# Patient Record
Sex: Female | Born: 1950 | Race: White | Hispanic: No | Marital: Married | Smoking: Never smoker
Health system: Southern US, Community
[De-identification: ages and names within clinical notes are randomized; demographics above are authoritative.]

## PROBLEM LIST (undated history)

## (undated) DIAGNOSIS — R519 Headache, unspecified: Secondary | ICD-10-CM

## (undated) DIAGNOSIS — K259 Gastric ulcer, unspecified as acute or chronic, without hemorrhage or perforation: Secondary | ICD-10-CM

## (undated) DIAGNOSIS — F419 Anxiety disorder, unspecified: Secondary | ICD-10-CM

## (undated) DIAGNOSIS — L570 Actinic keratosis: Secondary | ICD-10-CM

## (undated) DIAGNOSIS — C801 Malignant (primary) neoplasm, unspecified: Secondary | ICD-10-CM

## (undated) DIAGNOSIS — M790 Rheumatism, unspecified: Secondary | ICD-10-CM

## (undated) DIAGNOSIS — N94810 Vulvar vestibulitis: Secondary | ICD-10-CM

## (undated) DIAGNOSIS — N3289 Other specified disorders of bladder: Secondary | ICD-10-CM

## (undated) DIAGNOSIS — M810 Age-related osteoporosis without current pathological fracture: Secondary | ICD-10-CM

## (undated) DIAGNOSIS — E785 Hyperlipidemia, unspecified: Secondary | ICD-10-CM

## (undated) HISTORY — DX: Actinic keratosis: L57.0

## (undated) HISTORY — DX: Vulvar vestibulitis: N94.810

## (undated) HISTORY — DX: Rheumatism, unspecified: M79.0

## (undated) HISTORY — PX: RHINOPLASTY: SUR1284

## (undated) HISTORY — PX: TONSILLECTOMY: SUR1361

## (undated) HISTORY — PX: MASTECTOMY: SHX3

## (undated) HISTORY — PX: BREAST SURGERY: SHX581

---

## 2002-12-21 HISTORY — PX: COLONOSCOPY: SHX174

## 2006-02-05 ENCOUNTER — Emergency Department: Payer: Self-pay | Admitting: General Practice

## 2006-02-05 ENCOUNTER — Other Ambulatory Visit: Payer: Self-pay

## 2010-08-05 ENCOUNTER — Ambulatory Visit: Payer: Self-pay

## 2011-09-04 ENCOUNTER — Encounter: Payer: Self-pay | Admitting: Internal Medicine

## 2011-09-04 ENCOUNTER — Ambulatory Visit (INDEPENDENT_AMBULATORY_CARE_PROVIDER_SITE_OTHER): Payer: BC Managed Care – PPO | Admitting: Internal Medicine

## 2011-09-04 DIAGNOSIS — F411 Generalized anxiety disorder: Secondary | ICD-10-CM

## 2011-09-04 DIAGNOSIS — M949 Disorder of cartilage, unspecified: Secondary | ICD-10-CM

## 2011-09-04 DIAGNOSIS — Z0000 Encounter for general adult medical examination without abnormal findings: Secondary | ICD-10-CM

## 2011-09-04 DIAGNOSIS — F419 Anxiety disorder, unspecified: Secondary | ICD-10-CM

## 2011-09-04 DIAGNOSIS — M8580 Other specified disorders of bone density and structure, unspecified site: Secondary | ICD-10-CM

## 2011-09-04 MED ORDER — CLORAZEPATE DIPOTASSIUM 7.5 MG PO TABS: ORAL_TABLET | ORAL | Status: DC

## 2011-09-05 DIAGNOSIS — M810 Age-related osteoporosis without current pathological fracture: Secondary | ICD-10-CM | POA: Insufficient documentation

## 2011-09-05 DIAGNOSIS — F419 Anxiety disorder, unspecified: Secondary | ICD-10-CM | POA: Insufficient documentation

## 2011-09-05 NOTE — Progress Notes (Signed)
Subjective:    Patient ID: Kiara Donovan, female    DOB: November 25, 1951, 60 y.o.   MRN: [id number]  HPI Ms. Kiara Donovan is a 60 year old female who presents to establish care and for her annual exam. She denies any complaints today. She notes a history of anxiety, which has recently worsened in the setting of increased responsibilities and 2 during a student over the summer. She has been using clorazepate with resolution of her symptoms. Aside from this she has no complaints or concerns today. She reports a healthy diet. She reports that she is very active.  Outpatient Encounter Prescriptions as of 09/04/2011  Medication Sig Dispense Refill  . calcium carbonate (OS-CAL) 600 MG TABS Take 600 mg by mouth daily.        . Cholecalciferol (VITAMIN D3) 1000 UNITS CAPS Take 1 capsule by mouth daily.        . clorazepate (TRANXENE) 7.5 MG tablet 1.5 tablets by mouth  daily  45 tablet  5  . Multiple Vitamin (MULTIVITAMIN) capsule Take 1 capsule by mouth daily.        Marland Kitchen DISCONTD: clorazepate (TRANXENE) 7.5 MG tablet Take 7.5 mg by mouth daily.          Review of Systems  Constitutional: Negative for fever, chills, diaphoresis, activity change, appetite change, fatigue and unexpected weight change.  HENT: Negative for hearing loss, ear pain, congestion, sore throat, rhinorrhea, sneezing, drooling, mouth sores, trouble swallowing, dental problem, voice change, postnasal drip, sinus pressure and tinnitus.   Eyes: Negative for visual disturbance.  Respiratory: Negative for cough, choking, chest tightness and shortness of breath.   Cardiovascular: Negative for chest pain, palpitations and leg swelling.  Gastrointestinal: Negative for nausea, vomiting, abdominal pain, diarrhea, constipation, blood in stool, abdominal distention, anal bleeding and rectal pain.  Genitourinary: Positive for dyspareunia. Negative for dysuria, urgency, frequency, hematuria, flank pain, decreased urine volume, vaginal bleeding,  vaginal discharge, enuresis, difficulty urinating, vaginal pain, menstrual problem and pelvic pain.  Musculoskeletal: Negative for myalgias, back pain, joint swelling, arthralgias and gait problem.  Skin: Negative for color change, rash and wound.  Neurological: Negative for dizziness, tremors, seizures, syncope, facial asymmetry, speech difficulty, weakness, light-headedness, numbness and headaches.  Hematological: Negative for adenopathy. Does not bruise/bleed easily.  Psychiatric/Behavioral: Negative for suicidal ideas, hallucinations, sleep disturbance, dysphoric mood and decreased concentration. The patient is nervous/anxious.    BP 123/85  Pulse 79  Temp(Src) 98.6 F (37 C) (Oral)  Resp 12  Ht 5' 3.5" (1.613 m)  Wt 133 lb 8 oz (60.555 kg)  BMI 23.28 kg/m2  SpO2 100%     Objective:   Physical Exam  Constitutional: She is oriented to person, place, and time. She appears well-developed and well-nourished. No distress.  HENT:  Head: Normocephalic and atraumatic.  Right Ear: External ear normal.  Left Ear: External ear normal.  Nose: Nose normal.  Mouth/Throat: Oropharynx is clear and moist. No oropharyngeal exudate.  Eyes: Conjunctivae are normal. Pupils are equal, round, and reactive to light. Right eye exhibits no discharge. Left eye exhibits no discharge. No scleral icterus.  Neck: Normal range of motion. Neck supple. No tracheal deviation present. No thyromegaly present.  Cardiovascular: Normal rate, regular rhythm, normal heart sounds and intact distal pulses.  Exam reveals no gallop and no friction rub.   No murmur heard. Pulmonary/Chest: Effort normal and breath sounds normal. No respiratory distress. She has no wheezes. She has no rales. She exhibits no tenderness.  Abdominal: Soft. Bowel sounds are normal.  She exhibits no distension and no mass. There is no tenderness. There is no rebound and no guarding.  Genitourinary: Rectum normal and uterus normal. No breast swelling,  tenderness, discharge or bleeding. Pelvic exam was performed with patient prone. There is no rash, tenderness or lesion on the right labia. There is no rash, tenderness or lesion on the left labia. Uterus is not enlarged and not tender. Cervix exhibits friability. Cervix exhibits no motion tenderness and no discharge. Right adnexum displays no mass, no tenderness and no fullness. Left adnexum displays no mass, no tenderness and no fullness. There is erythema (atrophic vaginitis) around the vagina. No tenderness around the vagina. No vaginal discharge found.  Musculoskeletal: Normal range of motion. She exhibits no edema and no tenderness.  Lymphadenopathy:    She has no cervical adenopathy.  Neurological: She is alert and oriented to person, place, and time. No cranial nerve deficit. She exhibits normal muscle tone. Coordination normal.  Skin: Skin is warm and dry. No rash noted. She is not diaphoretic. No erythema. No pallor.  Psychiatric: She has a normal mood and affect. Her behavior is normal. Judgment and thought content normal.          Assessment & Plan:  1. General Exam - patient presents to establish care and for her annual exam. She appears to be very healthy. BMI is normal. She follows a healthy diet and gets regular physical activity. She is a nonsmoker. Her health maintenance including mammogram, colonoscopy, and vaccinations are up to date. We reviewed DEXA scan today, and she is due for repeat DEXA scan. We will schedule this. Pap smear was performed today. Breast exam was normal today. We will perform yearly lab work including CBC, CMP, lipid profile, vitamin D. We will plan to call her with results. She was encouraged to get a flu shot once they are available.  2. Anxiety - patient with history of intermittent anxiety. She currently uses clorazepate with resolution of her symptoms. Refill was given on this medication today. She will call or return to clinic should symptoms  worsen.  3. Osteopenia - previous DEXA scan showed some areas of osteopenia in her lumbar spine and hip. We will plan to repeat DEXA scan in the next few weeks. She is currently taking calcium and vitamin D. We will check vitamin D level with labs. We will call her with results of DEXA scan.

## 2011-09-07 ENCOUNTER — Other Ambulatory Visit (HOSPITAL_COMMUNITY)
Admission: RE | Admit: 2011-09-07 | Discharge: 2011-09-07 | Disposition: A | Discharge status: Home / Self Care | Payer: BC Managed Care – PPO | Source: Ambulatory Visit | Attending: Internal Medicine | Admitting: Internal Medicine

## 2011-09-07 DIAGNOSIS — Z01419 Encounter for gynecological examination (general) (routine) without abnormal findings: Secondary | ICD-10-CM | POA: Insufficient documentation

## 2011-09-07 DIAGNOSIS — Z1159 Encounter for screening for other viral diseases: Secondary | ICD-10-CM | POA: Insufficient documentation

## 2011-09-07 NOTE — Progress Notes (Signed)
Addended by: Jobie Quaker on: 09/07/2011 11:39 AM   Modules accepted: Orders

## 2011-09-08 LAB — HM PAP SMEAR: HM Pap smear: NEGATIVE

## 2011-09-08 NOTE — Progress Notes (Signed)
Addended by: Melody Comas L on: 09/08/2011 12:01 PM   Modules accepted: Orders

## 2011-09-10 ENCOUNTER — Encounter: Payer: Self-pay | Admitting: Internal Medicine

## 2011-09-10 ENCOUNTER — Other Ambulatory Visit (INDEPENDENT_AMBULATORY_CARE_PROVIDER_SITE_OTHER): Payer: BC Managed Care – PPO | Admitting: *Deleted

## 2011-09-10 DIAGNOSIS — Z0000 Encounter for general adult medical examination without abnormal findings: Secondary | ICD-10-CM

## 2011-09-10 LAB — CBC WITH DIFFERENTIAL/PLATELET
Eosinophils Relative: 6.2 % — ABNORMAL HIGH (ref 0.0–5.0)
HCT: 38.6 % (ref 36.0–46.0)
Lymphocytes Relative: 27.2 % (ref 12.0–46.0)
Monocytes Relative: 8.3 % (ref 3.0–12.0)
Neutrophils Relative %: 57.7 % (ref 43.0–77.0)
Platelets: 180 10*3/uL (ref 150.0–400.0)
WBC: 5.1 10*3/uL (ref 4.5–10.5)

## 2011-09-10 LAB — COMPREHENSIVE METABOLIC PANEL
Albumin: 4 g/dL (ref 3.5–5.2)
CO2: 30 mEq/L (ref 19–32)
GFR: 84.6 mL/min (ref >60.00)
Glucose, Bld: 84 mg/dL (ref 70–99)
Sodium: 140 mEq/L (ref 135–145)
Total Bilirubin: 0.7 mg/dL (ref 0.3–1.2)
Total Protein: 7.5 g/dL (ref 6.0–8.3)

## 2011-09-10 LAB — LIPID PANEL: Triglycerides: 76 mg/dL (ref 0.0–149.0)

## 2011-09-10 LAB — LDL CHOLESTEROL, DIRECT: Direct LDL: 133.7 mg/dL

## 2011-09-11 ENCOUNTER — Other Ambulatory Visit: Payer: BC Managed Care – PPO

## 2011-09-11 LAB — VITAMIN D 25 HYDROXY (VIT D DEFICIENCY, FRACTURES): Vit D, 25-Hydroxy: 53 ng/mL (ref 30–89)

## 2012-09-07 ENCOUNTER — Encounter: Payer: Self-pay | Admitting: Internal Medicine

## 2012-09-07 ENCOUNTER — Ambulatory Visit (INDEPENDENT_AMBULATORY_CARE_PROVIDER_SITE_OTHER): Payer: BC Managed Care – PPO | Admitting: Internal Medicine

## 2012-09-07 VITALS — BP 120/80 | HR 79 | Temp 98.7°F | Ht 63.5 in | Wt 136.5 lb

## 2012-09-07 DIAGNOSIS — Z0000 Encounter for general adult medical examination without abnormal findings: Secondary | ICD-10-CM

## 2012-09-07 DIAGNOSIS — F411 Generalized anxiety disorder: Secondary | ICD-10-CM

## 2012-09-07 DIAGNOSIS — Z1239 Encounter for other screening for malignant neoplasm of breast: Secondary | ICD-10-CM

## 2012-09-07 DIAGNOSIS — F419 Anxiety disorder, unspecified: Secondary | ICD-10-CM

## 2012-09-07 DIAGNOSIS — K649 Unspecified hemorrhoids: Secondary | ICD-10-CM

## 2012-09-07 DIAGNOSIS — Z23 Encounter for immunization: Secondary | ICD-10-CM

## 2012-09-07 DIAGNOSIS — B372 Candidiasis of skin and nail: Secondary | ICD-10-CM

## 2012-09-07 MED ORDER — CLORAZEPATE DIPOTASSIUM 7.5 MG PO TABS: ORAL_TABLET | ORAL | Status: DC

## 2012-09-07 MED ORDER — HYDROCORTISONE ACE-PRAMOXINE 2.5-1 % RE CREA: TOPICAL_CREAM | Freq: Three times a day (TID) | RECTAL | Status: DC

## 2012-09-07 MED ORDER — NYSTATIN-TRIAMCINOLONE 100000-0.1 UNIT/GM-% EX OINT: TOPICAL_OINTMENT | Freq: Two times a day (BID) | CUTANEOUS | Status: DC

## 2012-09-07 NOTE — Assessment & Plan Note (Signed)
General medical exam including breast exam normal today. PAP is UTD and was normal 2012.  Mammogram ordered. Flu vaccine given today. Will check basic labs including CMP, CBC, lipids with labs in 2 weeks fasting. Follow up 1 year and prn.

## 2012-09-07 NOTE — Assessment & Plan Note (Signed)
Intermittent anxiety which is alleviated with prn tranxene. Will continue.

## 2012-09-07 NOTE — Assessment & Plan Note (Signed)
Erythematous rash over her proximal right fourth finger consistent with fungal infection. Encouraged her to leave ring off her finger. Will try topical nystatin triamcinolone cream. Patient will call if symptoms are not improving.

## 2012-09-07 NOTE — Assessment & Plan Note (Signed)
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 Recent exacerbation not responsive to prn hydrocortisone. Will try Analpram. Encouraged use of miralax to help soften stools. Follow up if symptoms are not improving.

## 2012-09-07 NOTE — Progress Notes (Signed)
Subjective:    Patient ID: Kiara Donovan, female    DOB: [birth year], 60 y.o.   MRN: [id number]  HPI 61 year old female presents for annual exam. She reports she is generally doing well. She continues to follow a healthy diet and get regular physical activity. 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 She denies any noted side effects of this medication.  She is concerned today about worsening of chronic hemorrhoids. She notes she has been applying topical hydrocortisone cream with minimal improvement. The hemorrhoids have been present since the birth of her child. They do not typically bleed but cause some discomfort with bowel movement. She notes that her bowel movements are often hard secondary to constipation. She denies difficulty passing stool. She denies abdominal pain.  She is also concerned today about rash over her right fourth finger. Rash has occurred underneath her ring which she wears almost every day. She has tried both Lotrimin cream and topical triamcinolone cream prescribed by her dermatologist with minimal improvement. Rash is described as red and itching.  Outpatient Encounter Prescriptions as of 09/07/2012  Medication Sig Dispense Refill  . calcium carbonate (OS-CAL) 600 MG TABS Take 600 mg by mouth daily.        . Cholecalciferol (VITAMIN D3) 1000 UNITS CAPS Take 1 capsule by mouth daily.        . clorazepate (TRANXENE) 7.5 MG tablet 1.5 tablets by mouth  daily  45 tablet  5  . Multiple Vitamin (MULTIVITAMIN) capsule Take 1 capsule by mouth daily.        Marland Kitchen DISCONTD: clorazepate (TRANXENE) 7.5 MG tablet 1.5 tablets by mouth  daily  45 tablet  5  . hydrocortisone-pramoxine (ANALPRAM-HC) 2.5-1 % rectal cream Place rectally 3 (three) times daily.  30 g  3  . nystatin-triamcinolone ointment (MYCOLOG) Apply topically 2 (two) times daily.  30 g  0   BP 120/80  Pulse 79  Temp 98.7 F (37.1 C) (Oral)  Ht 5'  3.5" (1.613 m)  Wt 136 lb 8 oz (61.916 kg)  BMI 23.80 kg/m2  SpO2 96%  Review of Systems  Constitutional: Negative for fever, chills, appetite change, fatigue and unexpected weight change.  HENT: Negative for ear pain, congestion, sore throat, trouble swallowing, neck pain, voice change and sinus pressure.   Eyes: Negative for visual disturbance.  Respiratory: Negative for cough, shortness of breath, wheezing and stridor.   Cardiovascular: Negative for chest pain, palpitations and leg swelling.  Gastrointestinal: Positive for rectal pain. Negative for nausea, vomiting, abdominal pain, diarrhea, constipation, blood in stool, abdominal distention and anal bleeding.  Genitourinary: Negative for dysuria and flank pain.  Musculoskeletal: Negative for myalgias, arthralgias and gait problem.  Skin: Positive for rash. Negative for color change.  Neurological: Negative for dizziness and headaches.  Hematological: Negative for adenopathy. Does not bruise/bleed easily.  Psychiatric/Behavioral: Negative for suicidal ideas, disturbed wake/sleep cycle and dysphoric mood. The patient is nervous/anxious.        Objective:   Physical Exam  Constitutional: She is oriented to person, place, and time. She appears well-developed and well-nourished. No distress.  HENT:  Head: Normocephalic and atraumatic.  Right Ear: External ear normal.  Left Ear: External ear normal.  Nose: Nose normal.  Mouth/Throat: Oropharynx is clear and moist. No oropharyngeal exudate.  Eyes: Conjunctivae normal are normal. Pupils are equal, round, and reactive to light. Right eye exhibits no discharge. Left eye exhibits no discharge. No  scleral icterus.  Neck: Normal range of motion. Neck supple. No tracheal deviation present. No thyromegaly present.  Cardiovascular: Normal rate, regular rhythm, normal heart sounds and intact distal pulses.  Exam reveals no gallop and no friction rub.   No murmur heard. Pulmonary/Chest: Effort  normal and breath sounds normal. No accessory muscle usage. Not tachypneic. No respiratory distress. She has no wheezes. She has no rhonchi. She has no rales. She exhibits no tenderness. Right breast exhibits no inverted nipple, no mass, no nipple discharge, no skin change and no tenderness. Left breast exhibits no inverted nipple, no mass, no nipple discharge, no skin change and no tenderness.  Abdominal: Soft. Bowel sounds are normal. She exhibits no distension and no mass. There is no tenderness. There is no guarding.  Genitourinary: Rectal exam shows external hemorrhoid. Rectal exam shows no tenderness.  Musculoskeletal: Normal range of motion. She exhibits no edema and no tenderness.  Lymphadenopathy:    She has no cervical adenopathy.  Neurological: She is alert and oriented to person, place, and time. No cranial nerve deficit. She exhibits normal muscle tone. Coordination normal.  Skin: Skin is warm and dry. Rash noted. She is not diaphoretic. There is erythema (erythematous scaling rash right proximal fifth finger). No pallor.  Psychiatric: She has a normal mood and affect. Her behavior is normal. Judgment and thought content normal.          Assessment & Plan:

## 2012-09-16 ENCOUNTER — Other Ambulatory Visit (INDEPENDENT_AMBULATORY_CARE_PROVIDER_SITE_OTHER): Payer: BC Managed Care – PPO

## 2012-09-16 DIAGNOSIS — Z13220 Encounter for screening for lipoid disorders: Secondary | ICD-10-CM

## 2012-09-16 DIAGNOSIS — Z0000 Encounter for general adult medical examination without abnormal findings: Secondary | ICD-10-CM

## 2012-09-16 LAB — CBC WITH DIFFERENTIAL/PLATELET
Eosinophils Absolute: 0.5 10*3/uL (ref 0.0–0.7)
Eosinophils Relative: 8.7 % — ABNORMAL HIGH (ref 0.0–5.0)
Lymphocytes Relative: 25.4 % (ref 12.0–46.0)
MCHC: 33 g/dL (ref 30.0–36.0)
MCV: 92.1 fl (ref 78.0–100.0)
Monocytes Absolute: 0.5 10*3/uL (ref 0.1–1.0)
Neutrophils Relative %: 56.8 % (ref 43.0–77.0)
Platelets: 188 10*3/uL (ref 150.0–400.0)
WBC: 5.4 10*3/uL (ref 4.5–10.5)

## 2012-09-16 LAB — COMPREHENSIVE METABOLIC PANEL
Albumin: 3.5 g/dL (ref 3.5–5.2)
Alkaline Phosphatase: 47 U/L (ref 39–117)
BUN: 19 mg/dL (ref 6–23)
CO2: 29 mEq/L (ref 19–32)
GFR: 92.97 mL/min (ref >60.00)
Glucose, Bld: 93 mg/dL (ref 70–99)
Potassium: 4.2 mEq/L (ref 3.5–5.1)
Sodium: 139 mEq/L (ref 135–145)
Total Protein: 7.1 g/dL (ref 6.0–8.3)

## 2012-09-16 LAB — LDL CHOLESTEROL, DIRECT: Direct LDL: 139.2 mg/dL

## 2012-09-16 LAB — LIPID PANEL
Cholesterol: 209 mg/dL — ABNORMAL HIGH (ref 0–200)
Total CHOL/HDL Ratio: 4
Triglycerides: 52 mg/dL (ref 0.0–149.0)

## 2013-09-12 ENCOUNTER — Ambulatory Visit (INDEPENDENT_AMBULATORY_CARE_PROVIDER_SITE_OTHER): Payer: BC Managed Care – PPO | Admitting: Internal Medicine

## 2013-09-12 ENCOUNTER — Encounter: Payer: Self-pay | Admitting: Internal Medicine

## 2013-09-12 VITALS — BP 108/78 | HR 71 | Temp 98.4°F | Ht 63.5 in | Wt 135.0 lb

## 2013-09-12 DIAGNOSIS — Z1211 Encounter for screening for malignant neoplasm of colon: Secondary | ICD-10-CM

## 2013-09-12 DIAGNOSIS — R600 Localized edema: Secondary | ICD-10-CM

## 2013-09-12 DIAGNOSIS — R7989 Other specified abnormal findings of blood chemistry: Secondary | ICD-10-CM

## 2013-09-12 DIAGNOSIS — Z0000 Encounter for general adult medical examination without abnormal findings: Secondary | ICD-10-CM

## 2013-09-12 DIAGNOSIS — F419 Anxiety disorder, unspecified: Secondary | ICD-10-CM

## 2013-09-12 DIAGNOSIS — F411 Generalized anxiety disorder: Secondary | ICD-10-CM

## 2013-09-12 DIAGNOSIS — R609 Edema, unspecified: Secondary | ICD-10-CM

## 2013-09-12 DIAGNOSIS — Z23 Encounter for immunization: Secondary | ICD-10-CM

## 2013-09-12 DIAGNOSIS — M8580 Other specified disorders of bone density and structure, unspecified site: Secondary | ICD-10-CM

## 2013-09-12 DIAGNOSIS — M899 Disorder of bone, unspecified: Secondary | ICD-10-CM

## 2013-09-12 LAB — CBC WITH DIFFERENTIAL/PLATELET
Basophils Absolute: 0 10*3/uL (ref 0.0–0.1)
Basophils Relative: 0.6 % (ref 0.0–3.0)
Eosinophils Relative: 5.4 % — ABNORMAL HIGH (ref 0.0–5.0)
Hemoglobin: 12.3 g/dL (ref 12.0–15.0)
Lymphocytes Relative: 27.9 % (ref 12.0–46.0)
Monocytes Relative: 9.1 % (ref 3.0–12.0)
Neutro Abs: 3.4 10*3/uL (ref 1.4–7.7)
RBC: 4.14 Mil/uL (ref 3.87–5.11)
RDW: 13.4 % (ref 11.5–14.6)
WBC: 6.1 10*3/uL (ref 4.5–10.5)

## 2013-09-12 LAB — COMPREHENSIVE METABOLIC PANEL
ALT: 15 U/L (ref 0–35)
AST: 24 U/L (ref 0–37)
Alkaline Phosphatase: 45 U/L (ref 39–117)
Potassium: 4.5 mEq/L (ref 3.5–5.1)
Sodium: 139 mEq/L (ref 135–145)
Total Bilirubin: 0.6 mg/dL (ref 0.3–1.2)
Total Protein: 7.6 g/dL (ref 6.0–8.3)

## 2013-09-12 LAB — LIPID PANEL
Total CHOL/HDL Ratio: 4
VLDL: 12.2 mg/dL (ref 0.0–40.0)

## 2013-09-12 MED ORDER — CLORAZEPATE DIPOTASSIUM 7.5 MG PO TABS: ORAL_TABLET | ORAL | Status: DC

## 2013-09-12 NOTE — Assessment & Plan Note (Signed)
Recommended repeat bone density testing. Last testing 2012. Pt would like to hold off for now given potential cost of testing. She will look into insurance coverage.

## 2013-09-12 NOTE — Addendum Note (Signed)
Addended by: Theola Sequin on: 09/12/2013 04:55 PM   Modules accepted: Orders

## 2013-09-12 NOTE — Progress Notes (Signed)
Subjective:    Patient ID: Kiara Donovan, female    DOB: 1951-06-14, 62 y.o.   MRN: [id number]  HPI 62 year old female presents for annual exam. She reports that she is generally feeling well. She continues to have some intermittent anxiety. 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 She intermittently uses Tranxene to help control symptoms. She is tolerating this well.  She is concerned about intermittent swelling around her ankles. This typically occurs at the end of the day. It resolves overnight. She denies any shortness of breath, chest pain. She is physically active. She has not had any wounds on her legs.  She is trying to follow a healthy diet and regular physical activity. She is due for mammogram and colonoscopy.  Outpatient Encounter Prescriptions as of 09/12/2013  Medication Sig Dispense Refill  . calcium carbonate (OS-CAL) 600 MG TABS Take 600 mg by mouth daily.        . Cholecalciferol (VITAMIN D3) 1000 UNITS CAPS Take 1 capsule by mouth daily.        . clorazepate (TRANXENE) 7.5 MG tablet 1.5 tablets by mouth  daily  45 tablet  5  . fish oil-omega-3 fatty acids 1000 MG capsule Take 2 g by mouth daily.      . Multiple Vitamin (MULTIVITAMIN) capsule Take 1 capsule by mouth daily.        . hydrocortisone-pramoxine (ANALPRAM-HC) 2.5-1 % rectal cream Place rectally 3 (three) times daily.  30 g  3  . nystatin-triamcinolone ointment (MYCOLOG) Apply topically 2 (two) times daily.  30 g  0   No facility-administered encounter medications on file as of 09/12/2013.   BP 108/78  Pulse 71  Temp(Src) 98.4 F (36.9 C) (Oral)  Ht 5' 3.5" (1.613 m)  Wt 135 lb (61.236 kg)  BMI 23.54 kg/m2  SpO2 98%  Review of Systems  Constitutional: Negative for fever, chills, appetite change, fatigue and unexpected weight change.  HENT: Negative for ear pain, congestion, sore throat, trouble swallowing, neck pain, voice change and sinus pressure.   Eyes: Negative  for visual disturbance.  Respiratory: Negative for cough, shortness of breath, wheezing and stridor.   Cardiovascular: Positive for leg swelling. Negative for chest pain and palpitations.  Gastrointestinal: Negative for nausea, vomiting, abdominal pain, diarrhea, constipation, blood in stool, abdominal distention and anal bleeding.  Genitourinary: Negative for dysuria and flank pain.  Musculoskeletal: Negative for myalgias, arthralgias and gait problem.  Skin: Negative for color change and rash.  Neurological: Negative for dizziness and headaches.  Hematological: Negative for adenopathy. Does not bruise/bleed easily.  Psychiatric/Behavioral: Negative for suicidal ideas, sleep disturbance and dysphoric mood. The patient is nervous/anxious.        Objective:   Physical Exam  Constitutional: She is oriented to person, place, and time. She appears well-developed and well-nourished. No distress.  HENT:  Head: Normocephalic and atraumatic.  Right Ear: External ear normal.  Left Ear: External ear normal.  Nose: Nose normal.  Mouth/Throat: Oropharynx is clear and moist. No oropharyngeal exudate.  Eyes: Conjunctivae are normal. Pupils are equal, round, and reactive to light. Right eye exhibits no discharge. Left eye exhibits no discharge. No scleral icterus.  Neck: Normal range of motion. Neck supple. No tracheal deviation present. No thyromegaly present.  Cardiovascular: Normal rate, regular rhythm, normal heart sounds and intact distal pulses.  Exam reveals no gallop and no friction rub.   No murmur heard. Pulmonary/Chest: Effort normal and breath sounds normal. No accessory  muscle usage. Not tachypneic. No respiratory distress. She has no decreased breath sounds. She has no wheezes. She has no rhonchi. She has no rales. She exhibits no tenderness. Right breast exhibits no inverted nipple, no mass, no nipple discharge, no skin change and no tenderness. Left breast exhibits no inverted nipple, no  mass, no nipple discharge, no skin change and no tenderness. Breasts are symmetrical.  Abdominal: Soft. Bowel sounds are normal. She exhibits no distension and no mass. There is no tenderness. There is no rebound and no guarding.  Musculoskeletal: Normal range of motion. She exhibits no edema and no tenderness.  Lymphadenopathy:    She has no cervical adenopathy.  Neurological: She is alert and oriented to person, place, and time. No cranial nerve deficit. She exhibits normal muscle tone. Coordination normal.  Skin: Skin is warm and dry. No rash noted. She is not diaphoretic. No erythema. No pallor.  Psychiatric: She has a normal mood and affect. Her behavior is normal. Judgment and thought content normal.          Assessment & Plan:

## 2013-09-12 NOTE — Assessment & Plan Note (Signed)
Symptoms well controlled with intermittent use of Tranxene. Will continue. Also encouraged 30-48min exercise daily to help with stress.

## 2013-09-12 NOTE — Assessment & Plan Note (Signed)
General medical exam normal today including breast exam. PAP and pelvic deferred as PAP normal 2012, plan repeat 2015. Mammogram ordered. Colonoscopy ordered. Will check labs including CBC, CMP, lipids, TSH, Vit D. Encouraged healthy diet and regular physical activity. Discussed new cholesterol guidelines. Follow up 1 year and prn.

## 2013-09-12 NOTE — Assessment & Plan Note (Signed)
Intermittent LE edema. Exam normal today. Secondary to mild venous insufficiency. Encouraged use of compression stockings during the daytime. Pt will call if symptoms are worsening.

## 2013-09-13 ENCOUNTER — Encounter: Payer: BC Managed Care – PPO | Admitting: Internal Medicine

## 2013-09-13 LAB — LDL CHOLESTEROL, DIRECT: Direct LDL: 165.3 mg/dL

## 2013-09-14 ENCOUNTER — Encounter: Payer: Self-pay | Admitting: *Deleted

## 2013-09-24 LAB — HM COLONOSCOPY

## 2013-09-28 ENCOUNTER — Telehealth: Payer: Self-pay | Admitting: *Deleted

## 2013-09-28 NOTE — Telephone Encounter (Signed)
Patient left a voicemail stating she was returning a call she received from [facility] yesterday.

## 2013-10-02 NOTE — Telephone Encounter (Signed)
I have spoken with pt. She states is planning on scheduling her mammogram herself, was waiting until the colon ca screening had been s/u.

## 2013-10-13 LAB — HM MAMMOGRAPHY: HM MAMMO: NORMAL

## 2013-11-02 ENCOUNTER — Encounter: Payer: Self-pay | Admitting: Internal Medicine

## 2013-12-11 ENCOUNTER — Telehealth: Payer: Self-pay | Admitting: Internal Medicine

## 2013-12-11 ENCOUNTER — Emergency Department: Payer: Self-pay | Admitting: Emergency Medicine

## 2013-12-11 LAB — URINALYSIS, COMPLETE
Blood: NEGATIVE
Glucose,UR: NEGATIVE mg/dL (ref 0–75)
Ketone: NEGATIVE
Nitrite: NEGATIVE
Protein: NEGATIVE
RBC,UR: 1 /HPF (ref 0–5)
Specific Gravity: 1.016 (ref 1.003–1.030)
WBC UR: 1 /HPF (ref 0–5)

## 2013-12-11 LAB — BASIC METABOLIC PANEL
Anion Gap: 6 — ABNORMAL LOW (ref 7–16)
Calcium, Total: 9.3 mg/dL (ref 8.5–10.1)
Chloride: 105 mmol/L (ref 98–107)
Co2: 26 mmol/L (ref 21–32)
Creatinine: 0.72 mg/dL (ref 0.60–1.30)
EGFR (African American): >60
Sodium: 137 mmol/L (ref 136–145)

## 2013-12-11 LAB — CBC WITH DIFFERENTIAL/PLATELET
Basophil #: 0 10*3/uL (ref 0.0–0.1)
Basophil %: 0.4 %
Eosinophil #: 0.4 10*3/uL (ref 0.0–0.7)
Eosinophil %: 5.3 %
HGB: 13.3 g/dL (ref 12.0–16.0)
Lymphocyte #: 1.9 10*3/uL (ref 1.0–3.6)
Lymphocyte %: 27.6 %
MCH: 30.1 pg (ref 26.0–34.0)
MCHC: 33.7 g/dL (ref 32.0–36.0)
MCV: 89 fL (ref 80–100)
Monocyte #: 0.7 x10 3/mm (ref 0.2–0.9)
Monocyte %: 9.6 %
Neutrophil #: 3.9 10*3/uL (ref 1.4–6.5)
Neutrophil %: 57.1 %
RBC: 4.41 10*6/uL (ref 3.80–5.20)
RDW: 13.8 % (ref 11.5–14.5)
WBC: 6.9 10*3/uL (ref 3.6–11.0)

## 2013-12-11 LAB — HEPATIC FUNCTION PANEL A (ARMC)
Albumin: 3.6 g/dL (ref 3.4–5.0)
Bilirubin, Direct: <0.1 mg/dL (ref 0.00–0.20)
Bilirubin,Total: 0.5 mg/dL (ref 0.2–1.0)
SGOT(AST): 35 U/L (ref 15–37)
SGPT (ALT): 26 U/L (ref 12–78)
Total Protein: 7.8 g/dL (ref 6.4–8.2)

## 2013-12-11 LAB — LIPASE, BLOOD: Lipase: 121 U/L (ref 73–393)

## 2013-12-11 LAB — TROPONIN I: Troponin-I: <0.02 ng/mL

## 2013-12-11 NOTE — Telephone Encounter (Signed)
Patient called and requested return call from RN regarding nausea and BP fluctuation. A female answered the phone.  When RN identified herself as RN from [place] returning the call this female disconnected the call.

## 2013-12-12 NOTE — Telephone Encounter (Signed)
Spoke with patient she stated she went to the ED last night. BP kept increasing and they gave her IV fluids at the Ed last night and ran a lot of test on her. The virus she had began over a week, she felt better then began feeling worse again. Not having the bad stomach pain but has not had anything to eat last night. Also stated they were already in the ED when the nurse returned the call last night.

## 2013-12-12 NOTE — Telephone Encounter (Signed)
OK. We should make a follow up visit for her.

## 2013-12-14 ENCOUNTER — Emergency Department: Payer: Self-pay | Admitting: Emergency Medicine

## 2013-12-14 LAB — URINALYSIS, COMPLETE
Bilirubin,UR: NEGATIVE
Glucose,UR: NEGATIVE mg/dL (ref 0–75)
Hyaline Cast: 9
Leukocyte Esterase: NEGATIVE
Nitrite: NEGATIVE
Protein: 30
WBC UR: 2 /HPF (ref 0–5)

## 2013-12-14 LAB — CBC WITH DIFFERENTIAL/PLATELET
Basophil #: 0 10*3/uL (ref 0.0–0.1)
Basophil %: 0.6 %
Eosinophil #: 0.1 10*3/uL (ref 0.0–0.7)
HCT: 43.5 % (ref 35.0–47.0)
HGB: 14.8 g/dL (ref 12.0–16.0)
Lymphocyte #: 1.6 10*3/uL (ref 1.0–3.6)
MCH: 30.5 pg (ref 26.0–34.0)
MCV: 90 fL (ref 80–100)
Neutrophil #: 5.5 10*3/uL (ref 1.4–6.5)
Platelet: 256 10*3/uL (ref 150–440)
RBC: 4.86 10*6/uL (ref 3.80–5.20)
RDW: 13.7 % (ref 11.5–14.5)

## 2013-12-14 LAB — COMPREHENSIVE METABOLIC PANEL
Albumin: 4.2 g/dL (ref 3.4–5.0)
Alkaline Phosphatase: 74 U/L
Anion Gap: 4 — ABNORMAL LOW (ref 7–16)
Calcium, Total: 9.3 mg/dL (ref 8.5–10.1)
Chloride: 99 mmol/L (ref 98–107)
EGFR (African American): >60
EGFR (Non-African Amer.): >60
Glucose: 118 mg/dL — ABNORMAL HIGH (ref 65–99)
Potassium: 3.5 mmol/L (ref 3.5–5.1)
SGPT (ALT): 29 U/L (ref 12–78)
Sodium: 134 mmol/L — ABNORMAL LOW (ref 136–145)
Total Protein: 8.5 g/dL — ABNORMAL HIGH (ref 6.4–8.2)

## 2013-12-15 NOTE — Telephone Encounter (Signed)
Spoke with patient, she stated she actually had to go back to the ED last because of all the nausea she was having. They told her they think she may have an ulcer and gave her some medication. So far that has helped and also advised her to have an endoscopy done. Patient is already established with Dr. Mechele Collin and will call him on Monday since his office is not open today. She would prefer to see him as her next move then see Dr. Dan Humphreys. Informed patient I would pass this information on to Dr. Dan Humphreys and she may still need to see Dr. Dan Humphreys prior to see Dr. Mechele Collin.

## 2013-12-15 NOTE — Telephone Encounter (Signed)
OK. Can we set up a visit for her next week?

## 2013-12-19 ENCOUNTER — Ambulatory Visit: Payer: BC Managed Care – PPO | Admitting: Internal Medicine

## 2013-12-20 ENCOUNTER — Ambulatory Visit: Payer: Self-pay | Admitting: Unknown Physician Specialty

## 2013-12-20 NOTE — Telephone Encounter (Signed)
Spoke with patient, informed her Dr. Dan Humphreys wanted to see her in the office to follow up with some of the issues she has been having. Patient declined to schedule an appointment right now, stated she will call back to schedule at later time.

## 2013-12-22 LAB — PATHOLOGY REPORT

## 2014-01-25 ENCOUNTER — Encounter: Payer: Self-pay | Admitting: Internal Medicine

## 2014-05-24 ENCOUNTER — Other Ambulatory Visit: Payer: Self-pay | Admitting: Internal Medicine

## 2014-05-24 DIAGNOSIS — F419 Anxiety disorder, unspecified: Secondary | ICD-10-CM

## 2014-05-24 MED ORDER — CLORAZEPATE DIPOTASSIUM 7.5 MG PO TABS: ORAL_TABLET | ORAL | Status: DC

## 2014-05-24 NOTE — Telephone Encounter (Signed)
Appt 09/24/14, ok refill?

## 2014-05-24 NOTE — Telephone Encounter (Signed)
Patient needs a refill on clorazepate 7.5mg . Patient stated that she needs enough to last until her physical exam. Please call when rx ready/msn

## 2014-09-18 ENCOUNTER — Encounter: Payer: BC Managed Care – PPO | Admitting: Internal Medicine

## 2014-09-24 ENCOUNTER — Ambulatory Visit (INDEPENDENT_AMBULATORY_CARE_PROVIDER_SITE_OTHER): Payer: BC Managed Care – PPO | Admitting: Internal Medicine

## 2014-09-24 ENCOUNTER — Encounter: Payer: Self-pay | Admitting: Internal Medicine

## 2014-09-24 ENCOUNTER — Other Ambulatory Visit (HOSPITAL_COMMUNITY)
Admission: RE | Admit: 2014-09-24 | Discharge: 2014-09-24 | Disposition: A | Discharge status: Home / Self Care | Payer: BC Managed Care – PPO | Source: Ambulatory Visit | Attending: Internal Medicine | Admitting: Internal Medicine

## 2014-09-24 VITALS — BP 126/82 | HR 84 | Temp 98.0°F | Ht 63.5 in | Wt 129.2 lb

## 2014-09-24 DIAGNOSIS — Z1151 Encounter for screening for human papillomavirus (HPV): Secondary | ICD-10-CM | POA: Insufficient documentation

## 2014-09-24 DIAGNOSIS — K143 Hypertrophy of tongue papillae: Secondary | ICD-10-CM

## 2014-09-24 DIAGNOSIS — Z01419 Encounter for gynecological examination (general) (routine) without abnormal findings: Secondary | ICD-10-CM | POA: Diagnosis present

## 2014-09-24 DIAGNOSIS — F419 Anxiety disorder, unspecified: Secondary | ICD-10-CM

## 2014-09-24 DIAGNOSIS — Z0000 Encounter for general adult medical examination without abnormal findings: Secondary | ICD-10-CM

## 2014-09-24 LAB — COMPREHENSIVE METABOLIC PANEL
ALT: 17 U/L (ref 0–35)
AST: 22 U/L (ref 0–37)
Albumin: 4.2 g/dL (ref 3.5–5.2)
Alkaline Phosphatase: 47 U/L (ref 39–117)
BUN: 15 mg/dL (ref 6–23)
CO2: 26 meq/L (ref 19–32)
CREATININE: 0.7 mg/dL (ref 0.4–1.2)
Calcium: 9 mg/dL (ref 8.4–10.5)
Chloride: 101 mEq/L (ref 96–112)
GFR: 87.88 mL/min (ref >60.00)
Glucose, Bld: 84 mg/dL (ref 70–99)
POTASSIUM: 4.2 meq/L (ref 3.5–5.1)
Sodium: 138 mEq/L (ref 135–145)
Total Bilirubin: 0.8 mg/dL (ref 0.2–1.2)
Total Protein: 7.5 g/dL (ref 6.0–8.3)

## 2014-09-24 LAB — CBC WITH DIFFERENTIAL/PLATELET
BASOS PCT: 0.4 % (ref 0.0–3.0)
Basophils Absolute: 0 10*3/uL (ref 0.0–0.1)
Eosinophils Absolute: 0.3 10*3/uL (ref 0.0–0.7)
Eosinophils Relative: 3.9 % (ref 0.0–5.0)
HCT: 39 % (ref 36.0–46.0)
HEMOGLOBIN: 13.3 g/dL (ref 12.0–15.0)
LYMPHS PCT: 28.2 % (ref 12.0–46.0)
Lymphs Abs: 1.8 10*3/uL (ref 0.7–4.0)
MCHC: 34.1 g/dL (ref 30.0–36.0)
MCV: 89.6 fl (ref 78.0–100.0)
MONOS PCT: 7.3 % (ref 3.0–12.0)
Monocytes Absolute: 0.5 10*3/uL (ref 0.1–1.0)
NEUTROS ABS: 3.9 10*3/uL (ref 1.4–7.7)
NEUTROS PCT: 60.2 % (ref 43.0–77.0)
Platelets: 204 10*3/uL (ref 150.0–400.0)
RBC: 4.36 Mil/uL (ref 3.87–5.11)
RDW: 13.7 % (ref 11.5–15.5)
WBC: 6.4 10*3/uL (ref 4.0–10.5)

## 2014-09-24 LAB — LIPID PANEL
CHOL/HDL RATIO: 4
Cholesterol: 264 mg/dL — ABNORMAL HIGH (ref 0–200)
HDL: 60.8 mg/dL (ref >39.00)
LDL Cholesterol: 191 mg/dL — ABNORMAL HIGH (ref 0–99)
NONHDL: 203.2
TRIGLYCERIDES: 59 mg/dL (ref 0.0–149.0)
VLDL: 11.8 mg/dL (ref 0.0–40.0)

## 2014-09-24 LAB — MICROALBUMIN / CREATININE URINE RATIO
CREATININE, U: 143 mg/dL
Microalb Creat Ratio: 1 mg/g (ref 0.0–30.0)
Microalb, Ur: 1.5 mg/dL (ref 0.0–1.9)

## 2014-09-24 LAB — TSH: TSH: 1.28 u[IU]/mL (ref 0.35–4.50)

## 2014-09-24 MED ORDER — CLORAZEPATE DIPOTASSIUM 7.5 MG PO TABS: ORAL_TABLET | ORAL | Status: DC

## 2014-09-24 NOTE — Patient Instructions (Signed)

## 2014-09-24 NOTE — Addendum Note (Signed)
Addended by: Karlene Einstein D on: 09/24/2014 12:16 PM   Modules accepted: Orders

## 2014-09-24 NOTE — Assessment & Plan Note (Signed)
Slight white discoloration tongue. Likely normal. However given recent issues with diffuse tongue ulceration, culture sent today.

## 2014-09-24 NOTE — Addendum Note (Signed)
Addended by: Karlene Einstein D on: 09/24/2014 11:56 AM   Modules accepted: Orders

## 2014-09-24 NOTE — Progress Notes (Signed)
Subjective:    Patient ID: Kiara Donovan, female    DOB: [birth year], 63 y.o.   MRN: [id number]  HPI 63YO female presents for annual exam.  Aug 21st at beach, developed blisters on her tongue while at the beach. Seen at urgent care, treated with [employer]'s Magic Mouthwash. Had some improvement. Then developed white dots. Notes she tried mouthwash, ACT prior to trip. Continues to have some white patches.  Aside from this, feeling well. Needs to schedule mammogram. Would like to repeat PAP, last PAP in 2012 normal HPV neg. Colonoscopy last October was normal.  Review of Systems  Constitutional: Negative for fever, chills, appetite change, fatigue and unexpected weight change.  HENT: Negative for congestion, dental problem, mouth sores, postnasal drip, sore throat, trouble swallowing and voice change.   Eyes: Negative for visual disturbance.  Respiratory: Negative for shortness of breath.   Cardiovascular: Negative for chest pain and leg swelling.  Gastrointestinal: Negative for nausea, vomiting, abdominal pain, diarrhea and constipation.  Musculoskeletal: Negative for arthralgias, joint swelling and myalgias.  Skin: Negative for color change and rash.  Hematological: Negative for adenopathy. Does not bruise/bleed easily.  Psychiatric/Behavioral: Negative for sleep disturbance and dysphoric mood. The patient is not nervous/anxious.        Objective:    BP 126/82  Pulse 84  Temp(Src) 98 F (36.7 C) (Oral)  Ht 5' 3.5" (1.613 m)  Wt 129 lb 4 oz (58.627 kg)  BMI 22.53 kg/m2  SpO2 99% Physical Exam  Constitutional: She is oriented to person, place, and time. She appears well-developed and well-nourished. No distress.  HENT:  Head: Normocephalic and atraumatic.  Right Ear: External ear normal.  Left Ear: External ear normal.  Nose: Nose normal.  Mouth/Throat: Oropharynx is clear and moist. No oropharyngeal exudate.    Eyes: Conjunctivae are normal. Pupils are equal, round, and  reactive to light. Right eye exhibits no discharge. Left eye exhibits no discharge. No scleral icterus.  Neck: Normal range of motion. Neck supple. No tracheal deviation present. No thyromegaly present.  Cardiovascular: Normal rate, regular rhythm, normal heart sounds and intact distal pulses.  Exam reveals no gallop and no friction rub.   No murmur heard. Pulmonary/Chest: Effort normal and breath sounds normal. No respiratory distress. She has no wheezes. She has no rales. She exhibits no tenderness.  Abdominal: Soft. Bowel sounds are normal. She exhibits no distension and no mass. There is no tenderness. There is no rebound and no guarding.  Genitourinary: Rectum normal and uterus normal. No breast swelling, tenderness, discharge or bleeding. Pelvic exam was performed with patient supine. There is no rash, tenderness or lesion on the right labia. There is no rash, tenderness or lesion on the left labia. Uterus is not enlarged and not tender. Cervix exhibits friability. Cervix exhibits no motion tenderness and no discharge. Right adnexum displays no mass, no tenderness and no fullness. Left adnexum displays no mass, no tenderness and no fullness. There is erythema around the vagina. No tenderness around the vagina. No vaginal discharge found.  Musculoskeletal: Normal range of motion. She exhibits no edema and no tenderness.  Lymphadenopathy:    She has no cervical adenopathy.  Neurological: She is alert and oriented to person, place, and time. No cranial nerve deficit. She exhibits normal muscle tone. Coordination normal.  Skin: Skin is warm and dry. No rash noted. She is not diaphoretic. No erythema. No pallor.  Psychiatric: She has a normal mood and affect. Her behavior is normal. Judgment and  thought content normal.          Assessment & Plan:   Problem List Items Addressed This Visit     Unprioritized   Anxiety   Relevant Medications      clorazepate (TRANXENE) tablet   Routine  general medical examination at a health care facility - Primary     General medical exam normal today including breast and pelvic exam. PAP pending. Mammogram ordered. Colonoscopy UTD. Flu vaccine given today. Labs today including CBC, CMP, lipids, TSH. Encouraged healthy diet and exercise.    Relevant Orders      MM Digital Screening      CBC with Differential      Comprehensive metabolic panel      Lipid panel      Microalbumin / creatinine urine ratio      Vit D  25 hydroxy (rtn osteoporosis monitoring)      TSH   Tongue coating     Slight white discoloration tongue. Likely normal. However given recent issues with diffuse tongue ulceration, culture sent today.    Relevant Orders      Throat culture       Return in about 1 year (around 09/25/2015) for Wellness Visit.

## 2014-09-24 NOTE — Progress Notes (Signed)
Pre visit review using our clinic review tool, if applicable. No additional management support is needed unless otherwise documented below in the visit note. 

## 2014-09-24 NOTE — Assessment & Plan Note (Signed)
General medical exam normal today including breast and pelvic exam. PAP pending. Mammogram ordered. Colonoscopy UTD. Flu vaccine given today. Labs today including CBC, CMP, lipids, TSH. Encouraged healthy diet and exercise.

## 2014-09-25 LAB — HM PAP SMEAR: HM Pap smear: NEGATIVE

## 2014-09-25 LAB — CYTOLOGY - PAP

## 2014-09-26 ENCOUNTER — Other Ambulatory Visit: Payer: Self-pay | Admitting: *Deleted

## 2014-09-26 LAB — CULTURE, GROUP A STREP: ORGANISM ID, BACTERIA: NORMAL

## 2014-09-26 LAB — VITAMIN D 25 HYDROXY (VIT D DEFICIENCY, FRACTURES): VITD: 47.77 ng/mL (ref 30.00–100.00)

## 2014-09-26 MED ORDER — ATORVASTATIN CALCIUM 20 MG PO TABS: 20.0000 mg | ORAL_TABLET | Freq: Every day | ORAL | Status: DC

## 2014-10-08 ENCOUNTER — Telehealth: Payer: Self-pay | Admitting: *Deleted

## 2014-10-08 NOTE — Telephone Encounter (Signed)
Calling about throat culture, asking if everything is normal. Pt states that she still has white ring around "reddish" circle on throat, comes and goes in different areas, sometimes the areas are painful.  Was visible Monday, Tuesday and some on Wednesday of last week.  Stopped using Listerine and Act mouth washes. Pt asking what she should.

## 2014-10-09 NOTE — Telephone Encounter (Signed)
We should see her back in a visit to re-evaluate

## 2014-10-09 NOTE — Telephone Encounter (Signed)
Left VM for pt to return my call  

## 2014-10-15 LAB — HM MAMMOGRAPHY: HM MAMMO: NEGATIVE

## 2014-10-16 ENCOUNTER — Encounter: Payer: Self-pay | Admitting: *Deleted

## 2014-10-22 ENCOUNTER — Encounter: Payer: Self-pay | Admitting: Internal Medicine

## 2014-10-22 ENCOUNTER — Ambulatory Visit (INDEPENDENT_AMBULATORY_CARE_PROVIDER_SITE_OTHER): Payer: BC Managed Care – PPO | Admitting: Internal Medicine

## 2014-10-22 VITALS — BP 112/72 | HR 70 | Temp 98.1°F | Resp 14 | Ht 63.5 in | Wt 130.2 lb

## 2014-10-22 DIAGNOSIS — K143 Hypertrophy of tongue papillae: Secondary | ICD-10-CM

## 2014-10-22 DIAGNOSIS — E7849 Other hyperlipidemia: Secondary | ICD-10-CM | POA: Insufficient documentation

## 2014-10-22 DIAGNOSIS — E785 Hyperlipidemia, unspecified: Secondary | ICD-10-CM

## 2014-10-22 NOTE — Assessment & Plan Note (Signed)
Appearance of tongue most consistent with geographic tongue. Given that lesions are sometimes painful, will set up ENT evaluation. Question if biopsy might be helpful.

## 2014-10-22 NOTE — Progress Notes (Signed)
   Subjective:    Patient ID: Warner Mccreedy, female    DOB: 1951/09/15, 63 y.o.   MRN: [id number]  HPI 63YO female presents for follow up.  Concerned about persistent tongue alterations over the last month. Occasionally painful. Lesions come and go. No lesions at present. Previous culture taken from tongue in 09/2014 was normal.   Review of Systems  Constitutional: Negative for fever, chills, appetite change, fatigue and unexpected weight change.  HENT: Negative for congestion, postnasal drip, sore throat, trouble swallowing and voice change.   Eyes: Negative for visual disturbance.  Respiratory: Negative for shortness of breath.   Cardiovascular: Negative for chest pain and leg swelling.  Gastrointestinal: Negative for abdominal pain.  Skin: Negative for color change and rash.  Hematological: Negative for adenopathy. Does not bruise/bleed easily.  Psychiatric/Behavioral: Negative for dysphoric mood. The patient is not nervous/anxious.        Objective:    BP 112/72 mmHg  Pulse 70  Temp(Src) 98.1 F (36.7 C) (Oral)  Resp 14  Ht 5' 3.5" (1.613 m)  Wt 130 lb 4 oz (59.081 kg)  BMI 22.71 kg/m2  SpO2 98% Physical Exam  Constitutional: She is oriented to person, place, and time. She appears well-developed and well-nourished. No distress.  HENT:  Head: Normocephalic and atraumatic.  Right Ear: External ear normal.  Left Ear: External ear normal.  Nose: Nose normal.  Mouth/Throat: Oropharynx is clear and moist. No oral lesions. No oropharyngeal exudate, posterior oropharyngeal edema or posterior oropharyngeal erythema.  Tongue has normal appearance with no lesions or exudate seen.  Eyes: Conjunctivae are normal. Pupils are equal, round, and reactive to light. Right eye exhibits no discharge. Left eye exhibits no discharge. No scleral icterus.  Neck: Normal range of motion. Neck supple. No tracheal deviation present. No thyromegaly present.  Cardiovascular: Normal rate,  regular rhythm, normal heart sounds and intact distal pulses.  Exam reveals no gallop and no friction rub.   No murmur heard. Pulmonary/Chest: Effort normal and breath sounds normal. No accessory muscle usage. No tachypnea. No respiratory distress. She has no decreased breath sounds. She has no wheezes. She has no rhonchi. She has no rales. She exhibits no tenderness.  Musculoskeletal: Normal range of motion. She exhibits no edema or tenderness.  Lymphadenopathy:    She has no cervical adenopathy.  Neurological: She is alert and oriented to person, place, and time. No cranial nerve deficit. She exhibits normal muscle tone. Coordination normal.  Skin: Skin is warm and dry. No rash noted. She is not diaphoretic. No erythema. No pallor.  Psychiatric: She has a normal mood and affect. Her behavior is normal. Judgment and thought content normal.          Assessment & Plan:   Problem List Items Addressed This Visit      Unprioritized   Familial hyperlipidemia    Lab Results  Component Value Date   LDLCALC 191* 09/24/2014   Lipids elevated. Started on Atorvastatin. Will recheck lipids and LFTs  later this month.    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 Given that lesions are sometimes painful, will set up ENT evaluation. Question if biopsy might be helpful.    Relevant Orders      Ambulatory referral to ENT       Return in about 4 weeks (around 11/19/2014) for Recheck.

## 2014-10-22 NOTE — Patient Instructions (Addendum)
We will set up evaluation with ENT.  Labs on 11/9.  Follow up in 4 weeks.

## 2014-10-22 NOTE — Progress Notes (Signed)
Pre visit review using our clinic review tool, if applicable. No additional management support is needed unless otherwise documented below in the visit note. 

## 2014-10-22 NOTE — Assessment & Plan Note (Signed)
Lab Results  Component Value Date   LDLCALC 191* 09/24/2014   Lipids elevated. Started on Atorvastatin. Will recheck lipids and LFTs  later this month.

## 2014-10-26 ENCOUNTER — Telehealth: Payer: Self-pay | Admitting: *Deleted

## 2014-10-26 ENCOUNTER — Other Ambulatory Visit: Payer: Self-pay | Admitting: *Deleted

## 2014-10-26 DIAGNOSIS — E785 Hyperlipidemia, unspecified: Secondary | ICD-10-CM

## 2014-10-26 NOTE — Telephone Encounter (Signed)
Lipids and CMP for hyperlipidemia

## 2014-10-26 NOTE — Telephone Encounter (Signed)
Pt is coming in Monday what labs and dx? 

## 2014-10-29 ENCOUNTER — Other Ambulatory Visit (INDEPENDENT_AMBULATORY_CARE_PROVIDER_SITE_OTHER): Payer: BC Managed Care – PPO

## 2014-10-29 DIAGNOSIS — E785 Hyperlipidemia, unspecified: Secondary | ICD-10-CM

## 2014-10-29 LAB — LIPID PANEL
CHOL/HDL RATIO: 3
Cholesterol: 151 mg/dL (ref 0–200)
HDL: 48.6 mg/dL (ref >39.00)
LDL Cholesterol: 89 mg/dL (ref 0–99)
NonHDL: 102.4
Triglycerides: 67 mg/dL (ref 0.0–149.0)
VLDL: 13.4 mg/dL (ref 0.0–40.0)

## 2014-10-29 LAB — COMPREHENSIVE METABOLIC PANEL
ALT: 21 U/L (ref 0–35)
AST: 25 U/L (ref 0–37)
Albumin: 3.6 g/dL (ref 3.5–5.2)
Alkaline Phosphatase: 46 U/L (ref 39–117)
BUN: 16 mg/dL (ref 6–23)
CALCIUM: 9.3 mg/dL (ref 8.4–10.5)
CO2: 24 meq/L (ref 19–32)
CREATININE: 0.8 mg/dL (ref 0.4–1.2)
Chloride: 103 mEq/L (ref 96–112)
GFR: 80 mL/min (ref >60.00)
GLUCOSE: 89 mg/dL (ref 70–99)
Potassium: 4.1 mEq/L (ref 3.5–5.1)
Sodium: 139 mEq/L (ref 135–145)
Total Bilirubin: 0.7 mg/dL (ref 0.2–1.2)
Total Protein: 7.5 g/dL (ref 6.0–8.3)

## 2014-11-02 ENCOUNTER — Encounter: Payer: Self-pay | Admitting: Internal Medicine

## 2014-11-26 ENCOUNTER — Encounter (INDEPENDENT_AMBULATORY_CARE_PROVIDER_SITE_OTHER): Payer: Self-pay

## 2014-11-26 ENCOUNTER — Ambulatory Visit (INDEPENDENT_AMBULATORY_CARE_PROVIDER_SITE_OTHER): Payer: BC Managed Care – PPO | Admitting: Internal Medicine

## 2014-11-26 ENCOUNTER — Encounter: Payer: Self-pay | Admitting: Internal Medicine

## 2014-11-26 VITALS — BP 107/73 | HR 78 | Temp 98.2°F | Ht 63.5 in | Wt 130.5 lb

## 2014-11-26 DIAGNOSIS — K140 Glossitis: Secondary | ICD-10-CM

## 2014-11-26 LAB — CBC WITH DIFFERENTIAL/PLATELET
BASOS PCT: 0.4 % (ref 0.0–3.0)
Basophils Absolute: 0 10*3/uL (ref 0.0–0.1)
EOS PCT: 5.2 % — AB (ref 0.0–5.0)
Eosinophils Absolute: 0.3 10*3/uL (ref 0.0–0.7)
HCT: 38.8 % (ref 36.0–46.0)
Hemoglobin: 12.8 g/dL (ref 12.0–15.0)
LYMPHS PCT: 26.9 % (ref 12.0–46.0)
Lymphs Abs: 1.6 10*3/uL (ref 0.7–4.0)
MCHC: 32.9 g/dL (ref 30.0–36.0)
MCV: 90.6 fl (ref 78.0–100.0)
Monocytes Absolute: 0.5 10*3/uL (ref 0.1–1.0)
Monocytes Relative: 8.3 % (ref 3.0–12.0)
NEUTROS PCT: 59.2 % (ref 43.0–77.0)
Neutro Abs: 3.6 10*3/uL (ref 1.4–7.7)
PLATELETS: 190 10*3/uL (ref 150.0–400.0)
RBC: 4.29 Mil/uL (ref 3.87–5.11)
RDW: 13.8 % (ref 11.5–15.5)
WBC: 6.1 10*3/uL (ref 4.0–10.5)

## 2014-11-26 LAB — SEDIMENTATION RATE: SED RATE: 25 mm/h — AB (ref 0–22)

## 2014-11-26 LAB — C-REACTIVE PROTEIN

## 2014-11-26 MED ORDER — FLUCONAZOLE 100 MG PO TABS: 100.0000 mg | ORAL_TABLET | Freq: Every day | ORAL | Status: DC

## 2014-11-26 MED ORDER — ATORVASTATIN CALCIUM 20 MG PO TABS: 20.0000 mg | ORAL_TABLET | Freq: Every day | ORAL | Status: DC

## 2014-11-26 NOTE — Progress Notes (Signed)
Subjective:    Patient ID: Kiara Donovan, female    DOB: 07-Jul-1951, 63 y.o.   MRN: [id number]  HPI 63YO female presents for follow up.  Tongue coating - Seen by ENT. Diagnosed with Glossitis. No treatment given. Has kept log of symptoms and continues to have intermittent ulceration and streaking white areas on her tongue. At times painful. Not taking any new meds or OTC treatments.  No change in diet. No correlation with food.    Past medical, surgical, family and social history per today's encounter.  Review of Systems  Constitutional: Negative for fever, chills, appetite change, fatigue and unexpected weight change.  HENT: Positive for mouth sores. Negative for postnasal drip, rhinorrhea, sore throat, trouble swallowing and voice change.   Eyes: Negative for visual disturbance.  Respiratory: Negative for shortness of breath.   Cardiovascular: Negative for chest pain and leg swelling.  Gastrointestinal: Negative for abdominal pain.  Skin: Negative for color change and rash.  Hematological: Negative for adenopathy. Does not bruise/bleed easily.  Psychiatric/Behavioral: Negative for dysphoric mood. The patient is not nervous/anxious.        Objective:    BP 107/73 mmHg  Pulse 78  Temp(Src) 98.2 F (36.8 C) (Oral)  Ht 5' 3.5" (1.613 m)  Wt 130 lb 8 oz (59.194 kg)  BMI 22.75 kg/m2  SpO2 97% Physical Exam  Constitutional: She is oriented to person, place, and time. She appears well-developed and well-nourished. No distress.  HENT:  Head: Normocephalic and atraumatic.  Right Ear: External ear normal.  Left Ear: External ear normal.  Nose: Nose normal.  Mouth/Throat: Oral lesions present. Posterior oropharyngeal erythema present. No oropharyngeal exudate.    Eyes: Conjunctivae are normal. Pupils are equal, round, and reactive to light. Right eye exhibits no discharge. Left eye exhibits no discharge. No scleral icterus.  Neck: Normal range of motion. Neck supple. No  tracheal deviation present. No thyromegaly present.  Cardiovascular: Normal rate, regular rhythm, normal heart sounds and intact distal pulses.  Exam reveals no gallop and no friction rub.   No murmur heard. Pulmonary/Chest: Effort normal and breath sounds normal. No accessory muscle usage. No tachypnea. No respiratory distress. She has no decreased breath sounds. She has no wheezes. She has no rhonchi. She has no rales. She exhibits no tenderness.  Musculoskeletal: Normal range of motion. She exhibits no edema or tenderness.  Lymphadenopathy:    She has no cervical adenopathy.  Neurological: She is alert and oriented to person, place, and time. No cranial nerve deficit. She exhibits normal muscle tone. Coordination normal.  Skin: Skin is warm and dry. No rash noted. She is not diaphoretic. No erythema. No pallor.  Psychiatric: She has a normal mood and affect. Her behavior is normal. Judgment and thought content normal.          Assessment & Plan:   Problem List Items Addressed This Visit      Unprioritized   Tongue ulceration - Primary    Tongue ulceration and recurrent symptoms of white coating and redness. Previous culture was negative. Question if she may have thrush that was missed on culture. Will treat with Diflucan. Will also send testing for autoimmune inflammatory disorder which may have caused ulcerations. Follow up in 2-4 weeks.    Relevant Medications      fluconazole (DIFLUCAN) tablet 100 mg   Other Relevant Orders      ANA      CBC w/Diff      Sed Rate (ESR)  Anti-DNA antibody, double-stranded      C-reactive protein       Return in about 4 weeks (around 12/24/2014) for Recheck.

## 2014-11-26 NOTE — Progress Notes (Signed)
Pre visit review using our clinic review tool, if applicable. No additional management support is needed unless otherwise documented below in the visit note. 

## 2014-11-26 NOTE — Patient Instructions (Addendum)
Start Fluconazole 100mg  daily for 5 days.  Stop Atorvastatin while on this medication.  Labs today.  Follow up 2-4 weeks.

## 2014-11-26 NOTE — Assessment & Plan Note (Signed)
Tongue ulceration and recurrent symptoms of white coating and redness. Previous culture was negative. Question if she may have thrush that was missed on culture. Will treat with Diflucan. Will also send testing for autoimmune inflammatory disorder which may have caused ulcerations. Follow up in 2-4 weeks.

## 2014-11-27 ENCOUNTER — Encounter: Payer: Self-pay | Admitting: *Deleted

## 2014-11-27 LAB — ANTI-DNA ANTIBODY, DOUBLE-STRANDED: ds DNA Ab: 2 IU/mL

## 2014-11-28 LAB — ANTI-NUCLEAR AB-TITER (ANA TITER): ANA Titer 1: 1:40 {titer} — ABNORMAL HIGH

## 2014-11-28 LAB — ANA: ANA: POSITIVE — AB

## 2015-01-03 ENCOUNTER — Ambulatory Visit (INDEPENDENT_AMBULATORY_CARE_PROVIDER_SITE_OTHER): Payer: Medicare Other | Admitting: Internal Medicine

## 2015-01-03 ENCOUNTER — Encounter: Payer: Self-pay | Admitting: Internal Medicine

## 2015-01-03 VITALS — BP 93/63 | HR 94 | Temp 98.1°F | Ht 63.5 in | Wt 130.5 lb

## 2015-01-03 DIAGNOSIS — K140 Glossitis: Secondary | ICD-10-CM

## 2015-01-03 MED ORDER — ESOMEPRAZOLE MAGNESIUM 40 MG PO PACK: 40.0000 mg | PACK | Freq: Every day | ORAL | Status: DC

## 2015-01-03 NOTE — Patient Instructions (Signed)
We will set up evaluation with both oral surgery and allergy/immunology.  Follow up 3 months.

## 2015-01-03 NOTE — Progress Notes (Signed)
Pre visit review using our clinic review tool, if applicable. No additional management support is needed unless otherwise documented below in the visit note. 

## 2015-01-03 NOTE — Progress Notes (Signed)
   Subjective:    Patient ID: Kiara Donovan, female    DOB: 24-Oct-1951, 64 y.o.   MRN: [id number]  HPI 64YO female presents for follow up.  Persistent symptoms of tongue pain and ulceration that comes and goes. No improvement with Diflucan. Has tried starting back on Nexium with some improvement in ulceration. No current ulcerated areas. Notes that prior to recent episode of ulceration, she had increased intake of peaches, which is unusual for her.   Past medical, surgical, family and social history per today's encounter.  Review of Systems  Constitutional: Negative for fever, chills, appetite change, fatigue and unexpected weight change.  HENT: Negative for congestion, postnasal drip, rhinorrhea, sore throat and trouble swallowing.   Eyes: Negative for visual disturbance.  Respiratory: Negative for cough and shortness of breath.   Cardiovascular: Negative for chest pain and leg swelling.  Gastrointestinal: Negative for nausea, vomiting, abdominal pain, diarrhea and constipation.  Skin: Negative for color change and rash.  Hematological: Negative for adenopathy. Does not bruise/bleed easily.  Psychiatric/Behavioral: Negative for dysphoric mood. The patient is not nervous/anxious.        Objective:    BP 93/63 mmHg  Pulse 94  Temp(Src) 98.1 F (36.7 C) (Oral)  Ht 5' 3.5" (1.613 m)  Wt 130 lb 8 oz (59.194 kg)  BMI 22.75 kg/m2  SpO2 97% Physical Exam  Constitutional: She is oriented to person, place, and time. She appears well-developed and well-nourished. No distress.  HENT:  Head: Normocephalic and atraumatic.  Right Ear: External ear normal.  Left Ear: External ear normal.  Nose: Nose normal.  Mouth/Throat: Oropharynx is clear and moist. No oropharyngeal exudate.    Eyes: Conjunctivae are normal. Pupils are equal, round, and reactive to light. Right eye exhibits no discharge. Left eye exhibits no discharge. No scleral icterus.  Neck: Normal range of motion. Neck  supple. No tracheal deviation present. No thyromegaly present.  Cardiovascular: Normal rate, regular rhythm, normal heart sounds and intact distal pulses.  Exam reveals no gallop and no friction rub.   No murmur heard. Pulmonary/Chest: Effort normal and breath sounds normal. No accessory muscle usage. No tachypnea. No respiratory distress. She has no decreased breath sounds. She has no wheezes. She has no rhonchi. She has no rales. She exhibits no tenderness.  Musculoskeletal: Normal range of motion. She exhibits no edema or tenderness.  Lymphadenopathy:    She has no cervical adenopathy.  Neurological: She is alert and oriented to person, place, and time. No cranial nerve deficit. She exhibits normal muscle tone. Coordination normal.  Skin: Skin is warm and dry. No rash noted. She is not diaphoretic. No erythema. No pallor.  Psychiatric: She has a normal mood and affect. Her behavior is normal. Judgment and thought content normal.          Assessment & Plan:   Problem List Items Addressed This Visit      Unprioritized   Tongue ulceration - Primary    Unclear etiology of recurrent tongue ulceration. Will set up oral surgery evaluation for possible biopsy. Will also set up immunology evaluation for testing for food allergy.      Relevant Medications   NEXIUM 40 MG PO PACK   Other Relevant Orders   Ambulatory referral to Oral Maxillofacial Surgery   Ambulatory referral to Immunology       Return in about 3 months (around 04/04/2015) for Recheck.

## 2015-01-03 NOTE — Assessment & Plan Note (Signed)
Unclear etiology of recurrent tongue ulceration. Will set up oral surgery evaluation for possible biopsy. Will also set up immunology evaluation for testing for food allergy.

## 2015-09-26 ENCOUNTER — Ambulatory Visit (INDEPENDENT_AMBULATORY_CARE_PROVIDER_SITE_OTHER): Payer: Medicare Other | Admitting: Internal Medicine

## 2015-09-26 ENCOUNTER — Encounter: Payer: Self-pay | Admitting: Internal Medicine

## 2015-09-26 VITALS — BP 94/65 | HR 73 | Temp 98.4°F | Ht 63.2 in | Wt 132.0 lb

## 2015-09-26 DIAGNOSIS — Z23 Encounter for immunization: Secondary | ICD-10-CM | POA: Diagnosis not present

## 2015-09-26 DIAGNOSIS — Z0000 Encounter for general adult medical examination without abnormal findings: Secondary | ICD-10-CM

## 2015-09-26 DIAGNOSIS — F419 Anxiety disorder, unspecified: Secondary | ICD-10-CM

## 2015-09-26 DIAGNOSIS — Z79899 Other long term (current) drug therapy: Secondary | ICD-10-CM

## 2015-09-26 LAB — COMPREHENSIVE METABOLIC PANEL
ALT: 20 U/L (ref 0–35)
AST: 22 U/L (ref 0–37)
Albumin: 4.2 g/dL (ref 3.5–5.2)
Alkaline Phosphatase: 47 U/L (ref 39–117)
BUN: 13 mg/dL (ref 6–23)
CHLORIDE: 102 meq/L (ref 96–112)
CO2: 31 meq/L (ref 19–32)
CREATININE: 0.77 mg/dL (ref 0.40–1.20)
Calcium: 9.5 mg/dL (ref 8.4–10.5)
GFR: 79.78 mL/min (ref >60.00)
Glucose, Bld: 95 mg/dL (ref 70–99)
Potassium: 4.3 mEq/L (ref 3.5–5.1)
SODIUM: 138 meq/L (ref 135–145)
Total Bilirubin: 0.6 mg/dL (ref 0.2–1.2)
Total Protein: 7.6 g/dL (ref 6.0–8.3)

## 2015-09-26 LAB — CBC WITH DIFFERENTIAL/PLATELET
BASOS ABS: 0 10*3/uL (ref 0.0–0.1)
Basophils Relative: 0.6 % (ref 0.0–3.0)
EOS PCT: 4.9 % (ref 0.0–5.0)
Eosinophils Absolute: 0.3 10*3/uL (ref 0.0–0.7)
HEMATOCRIT: 40 % (ref 36.0–46.0)
Hemoglobin: 13.4 g/dL (ref 12.0–15.0)
LYMPHS PCT: 28.1 % (ref 12.0–46.0)
Lymphs Abs: 1.8 10*3/uL (ref 0.7–4.0)
MCHC: 33.4 g/dL (ref 30.0–36.0)
MCV: 90.9 fl (ref 78.0–100.0)
Monocytes Absolute: 0.5 10*3/uL (ref 0.1–1.0)
Monocytes Relative: 7.7 % (ref 3.0–12.0)
Neutro Abs: 3.9 10*3/uL (ref 1.4–7.7)
Neutrophils Relative %: 58.7 % (ref 43.0–77.0)
Platelets: 193 10*3/uL (ref 150.0–400.0)
RBC: 4.4 Mil/uL (ref 3.87–5.11)
RDW: 13.5 % (ref 11.5–15.5)
WBC: 6.6 10*3/uL (ref 4.0–10.5)

## 2015-09-26 LAB — LIPID PANEL
CHOL/HDL RATIO: 2
Cholesterol: 133 mg/dL (ref 0–200)
HDL: 53.6 mg/dL (ref >39.00)
LDL CALC: 69 mg/dL (ref 0–99)
NONHDL: 79.66
Triglycerides: 52 mg/dL (ref 0.0–149.0)
VLDL: 10.4 mg/dL (ref 0.0–40.0)

## 2015-09-26 LAB — MICROALBUMIN / CREATININE URINE RATIO
CREATININE, U: 103.8 mg/dL
MICROALB UR: 2.3 mg/dL — AB (ref 0.0–1.9)
Microalb Creat Ratio: 2.2 mg/g (ref 0.0–30.0)

## 2015-09-26 LAB — VITAMIN D 25 HYDROXY (VIT D DEFICIENCY, FRACTURES): VITD: 54.18 ng/mL (ref 30.00–100.00)

## 2015-09-26 MED ORDER — DEXAMETHASONE 0.5 MG/5ML PO ELIX: 1.0000 mg | ORAL_SOLUTION | Freq: Every day | ORAL | Status: DC | PRN

## 2015-09-26 MED ORDER — ATORVASTATIN CALCIUM 20 MG PO TABS: 20.0000 mg | ORAL_TABLET | Freq: Every day | ORAL | Status: DC

## 2015-09-26 MED ORDER — CLORAZEPATE DIPOTASSIUM 7.5 MG PO TABS: ORAL_TABLET | ORAL | Status: DC

## 2015-09-26 NOTE — Patient Instructions (Signed)
Health Maintenance, Female Adopting a healthy lifestyle and getting preventive care can go a long way to promote health and wellness. Talk with your health care provider about what schedule of regular examinations is right for you. This is a good chance for you to check in with your provider about disease prevention and staying healthy. In between checkups, there are plenty of things you can do on your own. Experts have done a lot of research about which lifestyle changes and preventive measures are most likely to keep you healthy. Ask your health care provider for more information. WEIGHT AND DIET  Eat a healthy diet  Be sure to include plenty of vegetables, fruits, low-fat dairy products, and lean protein.  Do not eat a lot of foods high in solid fats, added sugars, or salt.  Get regular exercise. This is one of the most important things you can do for your health.  Most adults should exercise for at least 150 minutes each week. The exercise should increase your heart rate and make you sweat (moderate-intensity exercise).  Most adults should also do strengthening exercises at least twice a week. This is in addition to the moderate-intensity exercise.  Maintain a healthy weight  Body mass index (BMI) is a measurement that can be used to identify possible weight problems. It estimates body fat based on height and weight. Your health care provider can help determine your BMI and help you achieve or maintain a healthy weight.  For females 20 years of age and older:   A BMI below 18.5 is considered underweight.  A BMI of 18.5 to 24.9 is normal.  A BMI of 25 to 29.9 is considered overweight.  A BMI of 30 and above is considered obese.  Watch levels of cholesterol and blood lipids  You should start having your blood tested for lipids and cholesterol at 65 years of age, then have this test every 5 years.  You may need to have your cholesterol levels checked more often if:  Your lipid  or cholesterol levels are high.  You are older than 65 years of age.  You are at high risk for heart disease.  CANCER SCREENING   Lung Cancer  Lung cancer screening is recommended for adults 55-80 years old who are at high risk for lung cancer because of a history of smoking.  A yearly low-dose CT scan of the lungs is recommended for people who:  Currently smoke.  Have quit within the past 15 years.  Have at least a 30-pack-year history of smoking. A pack year is smoking an average of one pack of cigarettes a day for 1 year.  Yearly screening should continue until it has been 15 years since you quit.  Yearly screening should stop if you develop a health problem that would prevent you from having lung cancer treatment.  Breast Cancer  Practice breast self-awareness. This means understanding how your breasts normally appear and feel.  It also means doing regular breast self-exams. Let your health care provider know about any changes, no matter how small.  If you are in your 20s or 30s, you should have a clinical breast exam (CBE) by a health care provider every 1-3 years as part of a regular health exam.  If you are 40 or older, have a CBE every year. Also consider having a breast X-ray (mammogram) every year.  If you have a family history of breast cancer, talk to your health care provider about genetic screening.  If you   are at high risk for breast cancer, talk to your health care provider about having an MRI and a mammogram every year.  Breast cancer gene (BRCA) assessment is recommended for women who have family members with BRCA-related cancers. BRCA-related cancers include:  Breast.  Ovarian.  Tubal.  Peritoneal cancers.  Results of the assessment will determine the need for genetic counseling and BRCA1 and BRCA2 testing. Cervical Cancer Your health care provider may recommend that you be screened regularly for cancer of the pelvic organs (ovaries, uterus, and  vagina). This screening involves a pelvic examination, including checking for microscopic changes to the surface of your cervix (Pap test). You may be encouraged to have this screening done every 3 years, beginning at age 21.  For women ages 30-65, health care providers may recommend pelvic exams and Pap testing every 3 years, or they may recommend the Pap and pelvic exam, combined with testing for human papilloma virus (HPV), every 5 years. Some types of HPV increase your risk of cervical cancer. Testing for HPV may also be done on women of any age with unclear Pap test results.  Other health care providers may not recommend any screening for nonpregnant women who are considered low risk for pelvic cancer and who do not have symptoms. Ask your health care provider if a screening pelvic exam is right for you.  If you have had past treatment for cervical cancer or a condition that could lead to cancer, you need Pap tests and screening for cancer for at least 20 years after your treatment. If Pap tests have been discontinued, your risk factors (such as having a new sexual partner) need to be reassessed to determine if screening should resume. Some women have medical problems that increase the chance of getting cervical cancer. In these cases, your health care provider may recommend more frequent screening and Pap tests. Colorectal Cancer  This type of cancer can be detected and often prevented.  Routine colorectal cancer screening usually begins at 65 years of age and continues through 65 years of age.  Your health care provider may recommend screening at an earlier age if you have risk factors for colon cancer.  Your health care provider may also recommend using home test kits to check for hidden blood in the stool.  A small camera at the end of a tube can be used to examine your colon directly (sigmoidoscopy or colonoscopy). This is done to check for the earliest forms of colorectal  cancer.  Routine screening usually begins at age 50.  Direct examination of the colon should be repeated every 5-10 years through 65 years of age. However, you may need to be screened more often if early forms of precancerous polyps or small growths are found. Skin Cancer  Check your skin from head to toe regularly.  Tell your health care provider about any new moles or changes in moles, especially if there is a change in a mole's shape or color.  Also tell your health care provider if you have a mole that is larger than the size of a pencil eraser.  Always use sunscreen. Apply sunscreen liberally and repeatedly throughout the day.  Protect yourself by wearing long sleeves, pants, a wide-brimmed hat, and sunglasses whenever you are outside. HEART DISEASE, DIABETES, AND HIGH BLOOD PRESSURE   High blood pressure causes heart disease and increases the risk of stroke. High blood pressure is more likely to develop in:  People who have blood pressure in the high end   of the normal range (130-139/85-89 mm Hg).  People who are overweight or obese.  People who are African American.  If you are 38-23 years of age, have your blood pressure checked every 3-5 years. If you are 61 years of age or older, have your blood pressure checked every year. You should have your blood pressure measured twice--once when you are at a hospital or clinic, and once when you are not at a hospital or clinic. Record the average of the two measurements. To check your blood pressure when you are not at a hospital or clinic, you can use:  An automated blood pressure machine at a pharmacy.  A home blood pressure monitor.  If you are between 45 years and 39 years old, ask your health care provider if you should take aspirin to prevent strokes.  Have regular diabetes screenings. This involves taking a blood sample to check your fasting blood sugar level.  If you are at a normal weight and have a low risk for diabetes,  have this test once every three years after 65 years of age.  If you are overweight and have a high risk for diabetes, consider being tested at a younger age or more often. PREVENTING INFECTION  Hepatitis B  If you have a higher risk for hepatitis B, you should be screened for this virus. You are considered at high risk for hepatitis B if:  You were born in a country where hepatitis B is common. Ask your health care provider which countries are considered high risk.  Your parents were born in a high-risk country, and you have not been immunized against hepatitis B (hepatitis B vaccine).  You have HIV or AIDS.  You use needles to inject street drugs.  You live with someone who has hepatitis B.  You have had sex with someone who has hepatitis B.  You get hemodialysis treatment.  You take certain medicines for conditions, including cancer, organ transplantation, and autoimmune conditions. Hepatitis C  Blood testing is recommended for:  Everyone born from [birth year] through 1966.  Anyone with known risk factors for hepatitis C. Sexually transmitted infections (STIs)  You should be screened for sexually transmitted infections (STIs) including gonorrhea and chlamydia if:  You are sexually active and are younger than 65 years of age.  You are older than 65 years of age and your health care provider tells you that you are at risk for this type of infection.  Your sexual activity has changed since you were last screened and you are at an increased risk for chlamydia or gonorrhea. Ask your health care provider if you are at risk.  If you do not have HIV, but are at risk, it may be recommended that you take a prescription medicine daily to prevent HIV infection. This is called pre-exposure prophylaxis (PrEP). You are considered at risk if:  You are sexually active and do not regularly use condoms or know the HIV status of your partner(s).  You take drugs by injection.  You are sexually  active with a partner who has HIV. Talk with your health care provider about whether you are at high risk of being infected with HIV. If you choose to begin PrEP, you should first be tested for HIV. You should then be tested every 3 months for as long as you are taking PrEP.  PREGNANCY   If you are premenopausal and you may become pregnant, ask your health care provider about preconception counseling.  If you may  become pregnant, take 400 to 800 micrograms (mcg) of folic acid every day.  If you want to prevent pregnancy, talk to your health care provider about birth control (contraception). OSTEOPOROSIS AND MENOPAUSE   Osteoporosis is a disease in which the bones lose minerals and strength with aging. This can result in serious bone fractures. Your risk for osteoporosis can be identified using a bone density scan.  If you are 61 years of age or older, or if you are at risk for osteoporosis and fractures, ask your health care provider if you should be screened.  Ask your health care provider whether you should take a calcium or vitamin D supplement to lower your risk for osteoporosis.  Menopause may have certain physical symptoms and risks.  Hormone replacement therapy may reduce some of these symptoms and risks. Talk to your health care provider about whether hormone replacement therapy is right for you.  HOME CARE INSTRUCTIONS   Schedule regular health, dental, and eye exams.  Stay current with your immunizations.   Do not use any tobacco products including cigarettes, chewing tobacco, or electronic cigarettes.  If you are pregnant, do not drink alcohol.  If you are breastfeeding, limit how much and how often you drink alcohol.  Limit alcohol intake to no more than 1 drink per day for nonpregnant women. One drink equals 12 ounces of beer, 5 ounces of wine, or 1 ounces of hard liquor.  Do not use street drugs.  Do not share needles.  Ask your health care provider for help if  you need support or information about quitting drugs.  Tell your health care provider if you often feel depressed.  Tell your health care provider if you have ever been abused or do not feel safe at home.   This information is not intended to replace advice given to you by your health care provider. Make sure you discuss any questions you have with your health care provider.   Document Released: 06/22/2011 Document Revised: 12/28/2014 Document Reviewed: 11/08/2013 Elsevier Interactive Patient Education [employer].

## 2015-09-26 NOTE — Progress Notes (Signed)
Pre visit review using our clinic review tool, if applicable. No additional management support is needed unless otherwise documented below in the visit note. 

## 2015-09-26 NOTE — Assessment & Plan Note (Signed)
General medical exam normal today including breast exam. PAP and pelvic deferred as normal 2015, plan repeat in 2018. Mammogram ordered. Colonoscopy UTD and reviewed. Labs as ordered. Flu vaccine and Prevnar today. Encouraged healthy diet and exercise.

## 2015-09-26 NOTE — Addendum Note (Signed)
Addended by: Vernetta Honey on: 09/26/2015 11:42 AM   Modules accepted: Orders

## 2015-09-26 NOTE — Progress Notes (Signed)
The patient is here for annual Medicare Wellness Examination and management of other chronic and acute problems.   The risk factors are reflected in the history.  The roster of all physicians providing medical care to patient - is listed in the Snapshot section of the chart.  Activities of daily living:   The patient is 100% independent in all ADLs: dressing, toileting, feeding as well as independent mobility. Patient lives with husband in a one story home with hardwood floors.  Home safety :  The patient has smoke detectors in the home.  They wear seatbelts in their car. There are no firearms at home.  There is no violence in the home. They feel safe where they live.  Infectious Risks: There is no risks for hepatitis, STDs or HIV.  There is no  history of blood transfusion.  They have no travel history to infectious disease endemic areas of the world.  Additional Health Care Providers: The patient has seen their dentist in the last six months. Dentist - Dr. Kenton Kingfisher They have not seen their eye doctor in the last year. Opthalmologist - none at present They deny hearing issues. They have deferred audiologic testing in the last year.   They do not  have excessive sun exposure. Discussed the need for sun protection: hats,long sleeves and use of sunscreen if there is significant sun exposure.  Dermatologist - Dr. Nehemiah Massed  Diet: the importance of a healthy diet is discussed. They do have a healthy diet.  The benefits of regular aerobic exercise were discussed. Patient exercises by walking and going to the gym to lift weights.  Depression screen: there are no signs or vegative symptoms of depression- irritability, change in appetite, anhedonia, sadness/tearfullness.  Cognitive assessment: the patient manages all their financial and personal affairs and is actively engaged. They could relate day,date,year and events.  HCPOA - none in place Living Will - none in place  The following  portions of the patient's history were reviewed and updated as appropriate: allergies, current medications, past family history, past medical history,  past surgical history, past social history and problem list.  Visual acuity was not assessed per patient preference as they have regular follow up with their ophthalmologist. Hearing and body mass index were assessed and reviewed.   During the course of the visit the patient was educated and counseled about appropriate screening and preventive services including : fall prevention , diabetes screening, nutrition counseling, colorectal cancer screening, and recommended immunizations.    Past Medical History  Diagnosis Date  . Rheumatic disease   . Vestibulitis, vulvar     seen at [facility] History  Problem Relation Age of Onset  . Cancer Mother     cervical and melanoma  . Cancer Father     prostate cancer  . Heart disease Father     s/p CABG  . Cancer Sister   . Heart disease Maternal Grandmother   . Heart disease Paternal Grandmother   . Heart disease Paternal Grandfather   . Miscarriages / Stillbirths Paternal Grandfather    Social History   Social History  . Marital Status: Married    Spouse Name: N/A  . Number of Children: N/A  . Years of Education: N/A   Social History Main Topics  . Smoking status: Never Smoker   . Smokeless tobacco: Never Used  . Alcohol Use: No  . Drug Use: No  . Sexual Activity: Not Asked   Other Topics Concern  . None  Social History Narrative   Lives in [place]. Married with 1 child. No pets. 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   Past Surgical History  Procedure Laterality Date  . Tonsillectomy    . Vaginal delivery    . Colonoscopy  2004    Dr. Kalman Drape, [facility], normal    Review of Systems  Constitutional: Negative for fever, chills, appetite change, fatigue and unexpected weight change.  HENT: Negative for trouble swallowing.   Eyes: Negative for visual disturbance.  Respiratory:  Negative for shortness of breath.   Cardiovascular: Negative for chest pain and leg swelling.  Gastrointestinal: Negative for nausea, vomiting, abdominal pain, diarrhea and constipation.  Musculoskeletal: Negative for myalgias and arthralgias.  Skin: Negative for color change and rash.  Hematological: Negative for adenopathy. Does not bruise/bleed easily.  Psychiatric/Behavioral: Negative for sleep disturbance and dysphoric mood. The patient is not nervous/anxious.        Objective:    BP 94/65 mmHg  Pulse 73  Temp(Src) 98.4 F (36.9 C) (Oral)  Ht 5' 3.2" (1.605 m)  Wt 132 lb (59.875 kg)  BMI 23.24 kg/m2  SpO2 98% Physical Exam  Constitutional: She is oriented to person, place, and time. She appears well-developed and well-nourished. No distress.  HENT:  Head: Normocephalic and atraumatic.  Right Ear: External ear normal.  Left Ear: External ear normal.  Nose: Nose normal.  Mouth/Throat: Oropharynx is clear and moist. No oropharyngeal exudate.  Eyes: Conjunctivae are normal. Pupils are equal, round, and reactive to light. Right eye exhibits no discharge. Left eye exhibits no discharge. No scleral icterus.  Neck: Normal range of motion. Neck supple. No tracheal deviation present. No thyromegaly present.  Cardiovascular: Normal rate, regular rhythm, normal heart sounds and intact distal pulses.  Exam reveals no gallop and no friction rub.   No murmur heard. Pulmonary/Chest: Effort normal and breath sounds normal. No accessory muscle usage. No tachypnea. No respiratory distress. She has no decreased breath sounds. She has no wheezes. She has no rales. She exhibits no tenderness. Right breast exhibits no inverted nipple, no mass, no nipple discharge, no skin change and no tenderness. Left breast exhibits no inverted nipple, no mass, no nipple discharge, no skin change and no tenderness. Breasts are symmetrical.  Abdominal: Soft. Bowel sounds are normal. She exhibits no distension and no  mass. There is no tenderness. There is no rebound and no guarding.  Musculoskeletal: Normal range of motion. She exhibits no edema or tenderness.  Lymphadenopathy:    She has no cervical adenopathy.  Neurological: She is alert and oriented to person, place, and time. No cranial nerve deficit. She exhibits normal muscle tone. Coordination normal.  Skin: Skin is warm and dry. No rash noted. She is not diaphoretic. No erythema. No pallor.  Psychiatric: She has a normal mood and affect. Her behavior is normal. Judgment and thought content normal.          Assessment & Plan:  Patient was given a handout regarding current recommendations for health maintenance and preventative care on the AVS.  Problem List Items Addressed This Visit      Unprioritized   Anxiety   Relevant Medications   clorazepate (TRANXENE) 7.5 MG tablet   Medicare annual wellness visit, initial - Primary    General medical exam normal today including breast exam. PAP and pelvic deferred as normal 2015, plan repeat in 2018. Mammogram ordered. Colonoscopy UTD and reviewed. Labs as ordered. Flu vaccine and Prevnar today. Encouraged healthy diet and exercise.  Relevant Orders   CBC with Differential/Platelet   Comprehensive metabolic panel   Lipid panel   Microalbumin / creatinine urine ratio   Vit D  25 hydroxy (rtn osteoporosis monitoring)   MM Digital Screening       Return in about 6 months (around 03/26/2016) for Recheck.

## 2015-10-18 LAB — HM MAMMOGRAPHY: HM MAMMO: NEGATIVE

## 2016-01-21 ENCOUNTER — Encounter: Payer: Self-pay | Admitting: *Deleted

## 2016-06-26 ENCOUNTER — Other Ambulatory Visit: Payer: Self-pay | Admitting: Internal Medicine

## 2016-06-26 ENCOUNTER — Telehealth: Payer: Self-pay | Admitting: Internal Medicine

## 2016-06-26 DIAGNOSIS — F419 Anxiety disorder, unspecified: Secondary | ICD-10-CM

## 2016-06-26 MED ORDER — ATORVASTATIN CALCIUM 20 MG PO TABS: 20.0000 mg | ORAL_TABLET | Freq: Every day | ORAL | Status: DC

## 2016-06-26 MED ORDER — CLORAZEPATE DIPOTASSIUM 7.5 MG PO TABS: ORAL_TABLET | ORAL | Status: DC

## 2016-06-26 NOTE — Telephone Encounter (Signed)
Refill for lipitor sent and other medication was sent to PCP for approval.

## 2016-06-26 NOTE — Telephone Encounter (Signed)
Refilled 09/2015 and last seen 09/26/2015. Please advise patient wants refill for a year?

## 2016-06-26 NOTE — Telephone Encounter (Signed)
Pt called wanting a medication refill for atorvastatin (LIPITOR) 20 MG tablet, clorazepate (TRANXENE) 7.5 MG tablet, Pt would like it refilled for the year if possible.   Pharmacy is [facility] [address] - [place], [place]  Call pt @ [phone] or home [phone]. Thank you!

## 2016-09-21 ENCOUNTER — Encounter: Payer: Self-pay | Admitting: Family

## 2016-09-21 ENCOUNTER — Ambulatory Visit (INDEPENDENT_AMBULATORY_CARE_PROVIDER_SITE_OTHER): Payer: Medicare Other | Admitting: Family

## 2016-09-21 VITALS — BP 116/76 | HR 83 | Temp 98.1°F | Ht 63.5 in | Wt 131.0 lb

## 2016-09-21 DIAGNOSIS — Z0000 Encounter for general adult medical examination without abnormal findings: Secondary | ICD-10-CM | POA: Diagnosis not present

## 2016-09-21 DIAGNOSIS — K140 Glossitis: Secondary | ICD-10-CM

## 2016-09-21 DIAGNOSIS — Z23 Encounter for immunization: Secondary | ICD-10-CM | POA: Diagnosis not present

## 2016-09-21 DIAGNOSIS — R599 Enlarged lymph nodes, unspecified: Secondary | ICD-10-CM | POA: Insufficient documentation

## 2016-09-21 DIAGNOSIS — K143 Hypertrophy of tongue papillae: Secondary | ICD-10-CM

## 2016-09-21 NOTE — Progress Notes (Signed)
Pre visit review using our clinic review tool, if applicable. No additional management support is needed unless otherwise documented below in the visit note. 

## 2016-09-21 NOTE — Patient Instructions (Addendum)
Pleasure meeting you.  Referral to ENT.

## 2016-09-21 NOTE — Assessment & Plan Note (Addendum)
New.Unable to appreciate during my exam however considering concerning location of supra clavicular area, pending further evaluation with CBC and [place]. No other B cell symptoms.

## 2016-09-21 NOTE — Progress Notes (Signed)
Subjective:    Patient ID: Kiara Donovan, female    DOB: [birth year], 65 y.o.   MRN: [id number]  CC: Kiara Donovan is a 65 y.o. female who presents today for an acute visit.    HPI: Patient here for acute visit for tongue ulcer intermittent for the past 2.5 years.  Describes as white spot and then white comes off and then becomes 'red and sore.' No vesicles. Able to drink and eat well. She would like referral for biopsy.   Has seen oral surgeon, Dr Lewanda Rife, who prescribed dexamethasone which clear it up but then it returns the following week.   Recently seen by dentist who gave her an azole which improved her tongue for 3 days and then returned.   No rash on hands, feet,fever, rash, night sweats, unintentional weight loss. No h/o STD.  Noted swollen lymph node right clavicle.   Notes cold sores on mouth when younger.  No h/o anemia.   Per chart review, normal throat culture 2015.   Had seen Dr. Ladene Artist, ENT, who thought it was glossitis or geographic tongue per patient.   Mom and sister have melanoma.          HISTORY:  Past Medical History:  Diagnosis Date  . Rheumatic disease   . Vestibulitis, vulvar    seen at [facility]   Past Surgical History:  Procedure Laterality Date  . COLONOSCOPY  2004   Dr. Kalman Drape, [facility], normal  . TONSILLECTOMY    . VAGINAL DELIVERY     Family History  Problem Relation Age of Onset  . Cancer Mother     cervical and melanoma  . Cancer Father     prostate cancer  . Heart disease Father     s/p CABG  . Cancer Sister     melanoma  . Heart disease Maternal Grandmother   . Heart disease Paternal Grandmother   . Heart disease Paternal Grandfather   . Miscarriages / Stillbirths Paternal Grandfather     Allergies: Review of patient's allergies indicates no known allergies. Current Outpatient Prescriptions on File Prior to Visit  Medication Sig Dispense Refill  . atorvastatin (LIPITOR) 20 MG tablet Take 1 tablet (20 mg total)  by mouth daily. 90 tablet 3  . calcium carbonate (OS-CAL) 600 MG TABS Take 600 mg by mouth daily.      . Cholecalciferol (VITAMIN D3) 1000 UNITS CAPS Take 1 capsule by mouth daily.      . clorazepate (TRANXENE) 7.5 MG tablet 1.5 tablets by mouth  daily 45 tablet 4  . dexamethasone 0.5 MG/5ML elixir Take 10 mLs (1 mg total) by mouth daily as needed. 100 mL 0  . fish oil-omega-3 fatty acids 1000 MG capsule Take 1 g by mouth daily.     . Multiple Vitamin (MULTIVITAMIN) capsule Take 1 capsule by mouth daily.       No current facility-administered medications on file prior to visit.     Social History  Substance Use Topics  . Smoking status: Never Smoker  . Smokeless tobacco: Never Used  . Alcohol use No    Review of Systems  Constitutional: Negative for chills and fever.  Respiratory: Negative for cough.   Cardiovascular: Negative for chest pain and palpitations.  Gastrointestinal: Negative for nausea and vomiting.      Objective:    BP 116/76   Pulse 83   Temp 98.1 F (36.7 C)   Ht 5' 3.5" (1.613 m)   Wt 131 lb (59.4  kg)   SpO2 98%   BMI 22.84 kg/m    Physical Exam  Constitutional: She appears well-developed and well-nourished.  HENT:  Mouth/Throat: Oral lesions present. No trismus in the jaw. No oropharyngeal exudate, posterior oropharyngeal edema, posterior oropharyngeal erythema or tonsillar abscesses.    Discrete Hypererythematous  patches on the dorsal left aspect of tongue No white film. No masses appreciated with palpation of tongue and gums.  Eyes: Conjunctivae are normal.  Neck: Neck supple. No thyroid mass and no thyromegaly present.    Right supraclavicular area where patient had appreciated enlarged area; unable to appreciate during my exam.   Cardiovascular: Normal rate, regular rhythm, normal heart sounds and normal pulses.   Pulmonary/Chest: Effort normal and breath sounds normal. She has no wheezes. She has no rhonchi. She has no rales.    Lymphadenopathy:       Head (right side): No submental, no submandibular, no tonsillar, no preauricular, no posterior auricular and no occipital adenopathy present.       Head (left side): No submental, no submandibular, no tonsillar, no preauricular, no posterior auricular and no occipital adenopathy present.    She has no cervical adenopathy.  Neurological: She is alert.  Skin: Skin is warm and dry.  Psychiatric: She has a normal mood and affect. Her speech is normal and behavior is normal. Thought content normal.  Vitals reviewed.      Assessment & Plan:   Problem List Items Addressed This Visit      Digestive   Tongue coating    Symptoms remain consistent with geographic tongue. However patient and I both would like to second opinion by ENT to ensure benign. Patient is very concerned by cancer and would like biopsy. I advised her that ENT to the appropriate next step and to start there and seen how they evaluate her. Labs for viral infection, anemia ordered to evaluate for underlying medical causes.         Immune and Lymphatic   Enlarged lymph nodes    Unable to appreciate during my exam however considering concerning location of supra clavicular area, pending further evaluation with CBC and [place]. No other B cell symptoms.       Relevant Orders   US Soft Tissue Head/Neck    Other Visit Diagnoses    Tongue ulcer    -  Primary   Relevant Orders   RPR   Ambulatory referral to ENT   HSV(herpes smplx)abs-1+2(IgG+IgM)-bld   HIV antibody   Fungus Culture & Smear   Encounter for medical examination to establish care       Relevant Orders   CBC with Differential/Platelet   Comprehensive metabolic panel   Hepatitis C antibody   Lipid panel   T4, free   VITAMIN D 25 Hydroxy (Vit-D Deficiency, Fractures)   TSH   Encounter for immunization       Relevant Orders   RPR   CBC with Differential/Platelet   Comprehensive metabolic panel   Hepatitis C antibody   Lipid panel    T4, free   VITAMIN D 25 Hydroxy (Vit-D Deficiency, Fractures)   TSH   Ambulatory referral to ENT   HSV(herpes smplx)abs-1+2(IgG+IgM)-bld   HIV antibody   Flu vaccine HIGH DOSE PF (Completed)   Fungus Culture & Smear         I am having Ms. Cuddeback maintain her multivitamin, calcium carbonate, Vitamin D3, fish oil-omega-3 fatty acids, dexamethasone, atorvastatin, and clorazepate.   No orders of the defined types  were placed in this encounter.   Return precautions given.   Risks, benefits, and alternatives of the medications and treatment plan prescribed today were discussed, and patient expressed understanding.   Education regarding symptom management and diagnosis given to patient on AVS.  Continue to follow with Mable Paris, FNP for routine health maintenance.   Kiara Donovan and I agreed with plan.   Mable Paris, FNP

## 2016-09-21 NOTE — Assessment & Plan Note (Signed)
Symptoms remain consistent with geographic tongue. However patient and I both would like to second opinion by ENT to ensure benign. Patient is very concerned by cancer and would like biopsy. I advised her that ENT to the appropriate next step and to start there and seen how they evaluate her. Labs for viral infection, anemia ordered to evaluate for underlying medical causes.

## 2016-09-28 ENCOUNTER — Ambulatory Visit
Admission: RE | Admit: 2016-09-28 | Discharge: 2016-09-28 | Disposition: A | Discharge status: Home / Self Care | Payer: Medicare Other | Source: Ambulatory Visit | Attending: Family | Admitting: Family

## 2016-09-28 DIAGNOSIS — R599 Enlarged lymph nodes, unspecified: Secondary | ICD-10-CM | POA: Diagnosis not present

## 2016-09-30 ENCOUNTER — Encounter: Payer: Medicare Other | Admitting: Internal Medicine

## 2016-09-30 ENCOUNTER — Encounter: Payer: Medicare Other | Admitting: Family

## 2016-10-01 ENCOUNTER — Other Ambulatory Visit (INDEPENDENT_AMBULATORY_CARE_PROVIDER_SITE_OTHER): Payer: Medicare Other

## 2016-10-01 DIAGNOSIS — Z0000 Encounter for general adult medical examination without abnormal findings: Secondary | ICD-10-CM

## 2016-10-01 DIAGNOSIS — K140 Glossitis: Secondary | ICD-10-CM | POA: Diagnosis not present

## 2016-10-01 DIAGNOSIS — Z23 Encounter for immunization: Secondary | ICD-10-CM

## 2016-10-01 LAB — LIPID PANEL
CHOLESTEROL: 137 mg/dL (ref 0–200)
HDL: 54.2 mg/dL (ref >39.00)
LDL CALC: 73 mg/dL (ref 0–99)
NonHDL: 82.85
TRIGLYCERIDES: 47 mg/dL (ref 0.0–149.0)
Total CHOL/HDL Ratio: 3
VLDL: 9.4 mg/dL (ref 0.0–40.0)

## 2016-10-01 LAB — CBC WITH DIFFERENTIAL/PLATELET
BASOS PCT: 0.6 % (ref 0.0–3.0)
Basophils Absolute: 0 10*3/uL (ref 0.0–0.1)
EOS PCT: 4.7 % (ref 0.0–5.0)
Eosinophils Absolute: 0.3 10*3/uL (ref 0.0–0.7)
HCT: 39.3 % (ref 36.0–46.0)
HEMOGLOBIN: 13.2 g/dL (ref 12.0–15.0)
LYMPHS ABS: 1.9 10*3/uL (ref 0.7–4.0)
Lymphocytes Relative: 29.9 % (ref 12.0–46.0)
MCHC: 33.5 g/dL (ref 30.0–36.0)
MCV: 90.5 fl (ref 78.0–100.0)
MONO ABS: 0.6 10*3/uL (ref 0.1–1.0)
MONOS PCT: 8.8 % (ref 3.0–12.0)
Neutro Abs: 3.5 10*3/uL (ref 1.4–7.7)
Neutrophils Relative %: 56 % (ref 43.0–77.0)
Platelets: 201 10*3/uL (ref 150.0–400.0)
RBC: 4.34 Mil/uL (ref 3.87–5.11)
RDW: 14 % (ref 11.5–15.5)
WBC: 6.2 10*3/uL (ref 4.0–10.5)

## 2016-10-01 LAB — COMPREHENSIVE METABOLIC PANEL
ALBUMIN: 4.1 g/dL (ref 3.5–5.2)
ALK PHOS: 47 U/L (ref 39–117)
ALT: 21 U/L (ref 0–35)
AST: 22 U/L (ref 0–37)
BUN: 13 mg/dL (ref 6–23)
CHLORIDE: 100 meq/L (ref 96–112)
CO2: 30 mEq/L (ref 19–32)
Calcium: 9.4 mg/dL (ref 8.4–10.5)
Creatinine, Ser: 0.73 mg/dL (ref 0.40–1.20)
GFR: 84.58 mL/min (ref >60.00)
Glucose, Bld: 91 mg/dL (ref 70–99)
POTASSIUM: 4.1 meq/L (ref 3.5–5.1)
SODIUM: 135 meq/L (ref 135–145)
TOTAL PROTEIN: 7.6 g/dL (ref 6.0–8.3)
Total Bilirubin: 0.7 mg/dL (ref 0.2–1.2)

## 2016-10-01 LAB — T4, FREE: Free T4: 0.91 ng/dL (ref 0.60–1.60)

## 2016-10-01 LAB — TSH: TSH: 1 u[IU]/mL (ref 0.35–4.50)

## 2016-10-01 LAB — VITAMIN D 25 HYDROXY (VIT D DEFICIENCY, FRACTURES): VITD: 48.27 ng/mL (ref 30.00–100.00)

## 2016-10-02 LAB — HSV(HERPES SMPLX)ABS-I+II(IGG+IGM)-BLD
HSV 1 GLYCOPROTEIN G AB, IGG: 29.8 {index} — AB (ref <0.90)
HSV 2 Glycoprotein G Ab, IgG: 1.11 Index — ABNORMAL HIGH (ref <0.90)
Herpes Simplex Vrs I&II-IgM Ab (EIA): 3.31 INDEX — ABNORMAL HIGH

## 2016-10-02 LAB — HIV ANTIBODY (ROUTINE TESTING W REFLEX): HIV 1&2 Ab, 4th Generation: NONREACTIVE

## 2016-10-02 LAB — HEPATITIS C ANTIBODY: HCV AB: NEGATIVE

## 2016-10-02 LAB — RPR

## 2016-10-08 ENCOUNTER — Encounter: Payer: Self-pay | Admitting: Family

## 2016-10-08 ENCOUNTER — Ambulatory Visit (INDEPENDENT_AMBULATORY_CARE_PROVIDER_SITE_OTHER): Payer: Medicare Other | Admitting: Family

## 2016-10-08 VITALS — BP 112/76 | HR 75 | Temp 97.8°F | Ht 64.0 in | Wt 131.6 lb

## 2016-10-08 DIAGNOSIS — F419 Anxiety disorder, unspecified: Secondary | ICD-10-CM

## 2016-10-08 DIAGNOSIS — M8580 Other specified disorders of bone density and structure, unspecified site: Secondary | ICD-10-CM

## 2016-10-08 DIAGNOSIS — Z23 Encounter for immunization: Secondary | ICD-10-CM

## 2016-10-08 DIAGNOSIS — Z0000 Encounter for general adult medical examination without abnormal findings: Secondary | ICD-10-CM

## 2016-10-08 DIAGNOSIS — Z1231 Encounter for screening mammogram for malignant neoplasm of breast: Secondary | ICD-10-CM | POA: Diagnosis not present

## 2016-10-08 MED ORDER — TETANUS-DIPHTH-ACELL PERTUSSIS 5-2.5-18.5 LF-MCG/0.5 IM SUSP: 0.5000 mL | Freq: Once | INTRAMUSCULAR | 0 refills | Status: AC

## 2016-10-08 MED ORDER — PNEUMOCOCCAL VAC POLYVALENT 25 MCG/0.5ML IJ INJ: 0.5000 mL | INJECTION | Freq: Once | INTRAMUSCULAR | 0 refills | Status: AC

## 2016-10-08 NOTE — Patient Instructions (Addendum)
Let me know about your mother's thyroid- if she has malignancy, please let me know and we will order [place] of your thyroid as we discussed.   Rx for tetanus to your pharmacy.  For post menopausal women, guidelines recommend a diet with 1200 mg of Calcium per day. If you are eating calcium rich foods, you do not need a calcium supplement. The body better absorbs the calcium that you eat over supplementation. If you do supplement, I recommend not supplementing the full 1200 mg/ day as this can lead to increased risk of cardiovascular disease. I recommend Calcium Citrate over the counter, and you may take a total of 600 to 800 mg per day in divided doses with meals for best absorption.   For bone health, you need adequate vitamin D, and I recommend you supplement as it is harder to do so with diet alone. I recommend cholecalciferol 800 units daily.  Also, please ensure you are following a diet high in calcium -- research shows better outcomes with dietary sources including kale, yogurt, broccolii, cheese, okra, almonds- to name a few.     Also remember that exercise is a great medicine for maintain and preserve bone health. Advise moderate exercise for 30 minutes , 3 times per week.   Health Maintenance, Female Adopting a healthy lifestyle and getting preventive care can go a long way to promote health and wellness. Talk with your health care provider about what schedule of regular examinations is right for you. This is a good chance for you to check in with your provider about disease prevention and staying healthy. In between checkups, there are plenty of things you can do on your own. Experts have done a lot of research about which lifestyle changes and preventive measures are most likely to keep you healthy. Ask your health care provider for more information. WEIGHT AND DIET  Eat a healthy diet  Be sure to include plenty of vegetables, fruits, low-fat dairy products, and lean protein.  Do not eat a  lot of foods high in solid fats, added sugars, or salt.  Get regular exercise. This is one of the most important things you can do for your health.  Most adults should exercise for at least 150 minutes each week. The exercise should increase your heart rate and make you sweat (moderate-intensity exercise).  Most adults should also do strengthening exercises at least twice a week. This is in addition to the moderate-intensity exercise.  Maintain a healthy weight  Body mass index (BMI) is a measurement that can be used to identify possible weight problems. It estimates body fat based on height and weight. Your health care provider can help determine your BMI and help you achieve or maintain a healthy weight.  For females 40 years of age and older:   A BMI below 18.5 is considered underweight.  A BMI of 18.5 to 24.9 is normal.  A BMI of 25 to 29.9 is considered overweight.  A BMI of 30 and above is considered obese.  Watch levels of cholesterol and blood lipids  You should start having your blood tested for lipids and cholesterol at 66 years of age, then have this test every 5 years.  You may need to have your cholesterol levels checked more often if:  Your lipid or cholesterol levels are high.  You are older than 66 years of age.  You are at high risk for heart disease.  CANCER SCREENING   Lung Cancer  Lung cancer screening  is recommended for adults 20-72 years old who are at high risk for lung cancer because of a history of smoking.  A yearly low-dose CT scan of the lungs is recommended for people who:  Currently smoke.  Have quit within the past 15 years.  Have at least a 30-pack-year history of smoking. A pack year is smoking an average of one pack of cigarettes a day for 1 year.  Yearly screening should continue until it has been 15 years since you quit.  Yearly screening should stop if you develop a health problem that would prevent you from having lung cancer  treatment.  Breast Cancer  Practice breast self-awareness. This means understanding how your breasts normally appear and feel.  It also means doing regular breast self-exams. Let your health care provider know about any changes, no matter how small.  If you are in your 20s or 30s, you should have a clinical breast exam (CBE) by a health care provider every 1-3 years as part of a regular health exam.  If you are 26 or older, have a CBE every year. Also consider having a breast X-ray (mammogram) every year.  If you have a family history of breast cancer, talk to your health care provider about genetic screening.  If you are at high risk for breast cancer, talk to your health care provider about having an MRI and a mammogram every year.  Breast cancer gene (BRCA) assessment is recommended for women who have family members with BRCA-related cancers. BRCA-related cancers include:  Breast.  Ovarian.  Tubal.  Peritoneal cancers.  Results of the assessment will determine the need for genetic counseling and BRCA1 and BRCA2 testing. Cervical Cancer Your health care provider may recommend that you be screened regularly for cancer of the pelvic organs (ovaries, uterus, and vagina). This screening involves a pelvic examination, including checking for microscopic changes to the surface of your cervix (Pap test). You may be encouraged to have this screening done every 3 years, beginning at age 61.  For women ages 107-65, health care providers may recommend pelvic exams and Pap testing every 3 years, or they may recommend the Pap and pelvic exam, combined with testing for human papilloma virus (HPV), every 5 years. Some types of HPV increase your risk of cervical cancer. Testing for HPV may also be done on women of any age with unclear Pap test results.  Other health care providers may not recommend any screening for nonpregnant women who are considered low risk for pelvic cancer and who do not have  symptoms. Ask your health care provider if a screening pelvic exam is right for you.  If you have had past treatment for cervical cancer or a condition that could lead to cancer, you need Pap tests and screening for cancer for at least 20 years after your treatment. If Pap tests have been discontinued, your risk factors (such as having a new sexual partner) need to be reassessed to determine if screening should resume. Some women have medical problems that increase the chance of getting cervical cancer. In these cases, your health care provider may recommend more frequent screening and Pap tests. Colorectal Cancer  This type of cancer can be detected and often prevented.  Routine colorectal cancer screening usually begins at 66 years of age and continues through 66 years of age.  Your health care provider may recommend screening at an earlier age if you have risk factors for colon cancer.  Your health care provider may also  recommend using home test kits to check for hidden blood in the stool.  A small camera at the end of a tube can be used to examine your colon directly (sigmoidoscopy or colonoscopy). This is done to check for the earliest forms of colorectal cancer.  Routine screening usually begins at age 87.  Direct examination of the colon should be repeated every 5-10 years through 66 years of age. However, you may need to be screened more often if early forms of precancerous polyps or small growths are found. Skin Cancer  Check your skin from head to toe regularly.  Tell your health care provider about any new moles or changes in moles, especially if there is a change in a mole's shape or color.  Also tell your health care provider if you have a mole that is larger than the size of a pencil eraser.  Always use sunscreen. Apply sunscreen liberally and repeatedly throughout the day.  Protect yourself by wearing long sleeves, pants, a wide-brimmed hat, and sunglasses whenever you are  outside. HEART DISEASE, DIABETES, AND HIGH BLOOD PRESSURE   High blood pressure causes heart disease and increases the risk of stroke. High blood pressure is more likely to develop in:  People who have blood pressure in the high end of the normal range (130-139/85-89 mm Hg).  People who are overweight or obese.  People who are African American.  If you are 37-23 years of age, have your blood pressure checked every 3-5 years. If you are 9 years of age or older, have your blood pressure checked every year. You should have your blood pressure measured twice--once when you are at a hospital or clinic, and once when you are not at a hospital or clinic. Record the average of the two measurements. To check your blood pressure when you are not at a hospital or clinic, you can use:  An automated blood pressure machine at a pharmacy.  A home blood pressure monitor.  If you are between 59 years and 74 years old, ask your health care provider if you should take aspirin to prevent strokes.  Have regular diabetes screenings. This involves taking a blood sample to check your fasting blood sugar level.  If you are at a normal weight and have a low risk for diabetes, have this test once every three years after 66 years of age.  If you are overweight and have a high risk for diabetes, consider being tested at a younger age or more often. PREVENTING INFECTION  Hepatitis B  If you have a higher risk for hepatitis B, you should be screened for this virus. You are considered at high risk for hepatitis B if:  You were born in a country where hepatitis B is common. Ask your health care provider which countries are considered high risk.  Your parents were born in a high-risk country, and you have not been immunized against hepatitis B (hepatitis B vaccine).  You have HIV or AIDS.  You use needles to inject street drugs.  You live with someone who has hepatitis B.  You have had sex with someone who has  hepatitis B.  You get hemodialysis treatment.  You take certain medicines for conditions, including cancer, organ transplantation, and autoimmune conditions. Hepatitis C  Blood testing is recommended for:  Everyone born from [birth year] through 1966.  Anyone with known risk factors for hepatitis C. Sexually transmitted infections (STIs)  You should be screened for sexually transmitted infections (STIs) including gonorrhea and  chlamydia if:  You are sexually active and are younger than 66 years of age.  You are older than 66 years of age and your health care provider tells you that you are at risk for this type of infection.  Your sexual activity has changed since you were last screened and you are at an increased risk for chlamydia or gonorrhea. Ask your health care provider if you are at risk.  If you do not have HIV, but are at risk, it may be recommended that you take a prescription medicine daily to prevent HIV infection. This is called pre-exposure prophylaxis (PrEP). You are considered at risk if:  You are sexually active and do not regularly use condoms or know the HIV status of your partner(s).  You take drugs by injection.  You are sexually active with a partner who has HIV. Talk with your health care provider about whether you are at high risk of being infected with HIV. If you choose to begin PrEP, you should first be tested for HIV. You should then be tested every 3 months for as long as you are taking PrEP.  PREGNANCY   If you are premenopausal and you may become pregnant, ask your health care provider about preconception counseling.  If you may become pregnant, take 400 to 800 micrograms (mcg) of folic acid every day.  If you want to prevent pregnancy, talk to your health care provider about birth control (contraception). OSTEOPOROSIS AND MENOPAUSE   Osteoporosis is a disease in which the bones lose minerals and strength with aging. This can result in serious bone  fractures. Your risk for osteoporosis can be identified using a bone density scan.  If you are 42 years of age or older, or if you are at risk for osteoporosis and fractures, ask your health care provider if you should be screened.  Ask your health care provider whether you should take a calcium or vitamin D supplement to lower your risk for osteoporosis.  Menopause may have certain physical symptoms and risks.  Hormone replacement therapy may reduce some of these symptoms and risks. Talk to your health care provider about whether hormone replacement therapy is right for you.  HOME CARE INSTRUCTIONS   Schedule regular health, dental, and eye exams.  Stay current with your immunizations.   Do not use any tobacco products including cigarettes, chewing tobacco, or electronic cigarettes.  If you are pregnant, do not drink alcohol.  If you are breastfeeding, limit how much and how often you drink alcohol.  Limit alcohol intake to no more than 1 drink per day for nonpregnant women. One drink equals 12 ounces of beer, 5 ounces of wine, or 1 ounces of hard liquor.  Do not use street drugs.  Do not share needles.  Ask your health care provider for help if you need support or information about quitting drugs.  Tell your health care provider if you often feel depressed.  Tell your health care provider if you have ever been abused or do not feel safe at home.   This information is not intended to replace advice given to you by your health care provider. Make sure you discuss any questions you have with your health care provider.   Document Released: 06/22/2011 Document Revised: 12/28/2014 Document Reviewed: 11/08/2013 Elsevier Interactive Patient Education [employer].

## 2016-10-08 NOTE — Assessment & Plan Note (Signed)
Stable on current dose. Discussed risks of BZDs and patient agreed with long term plan of decreasing/discontinuing. Patient uses 3 times per week and we are using this as benchmark. If she goes over this, we will consider Zoloft. New CSC.

## 2016-10-08 NOTE — Progress Notes (Signed)
Subjective:    Patient ID: Kiara Donovan, female    DOB: [birth year], 65 y.o.   MRN: [id number]  CC: Kiara Donovan is a 65 y.o. female who presents today for physical exam.    HPI: Patient presents physical exam.  Anxiety- on clorazepate.Takes 3x per week. Takes one tablet or 1/2 tablet. Has been on it for years. Uses at bedtime primarily. Stable. No depression.    Tongue rash has resolved; improved on Kefir which was recommended by by ENT       Colorectal Cancer Screening: UTD 2014, normal repeat in 10 years.  Breast Cancer Screening: Mammogram due Cervical Cancer Screening: UTD 2015 at 63 years ago, normal , negative. Unable to see record. Bone Health screening/DEXA for 65+: Normal DEXA per patient 2014. No recent fractures.  Immunizations       Tetanus - Due        Pneumococcal - Candidate for.  Labs: Screening labs today. Exercise: Gets regular exercise.  Alcohol use: None Smoking/tobacco use: Nonsmoker.  Regular dental exams: UTD Wears seat belt: Yes.  HISTORY:  Past Medical History:  Diagnosis Date  . Rheumatic disease   . Vestibulitis, vulvar    seen at [facility]    Past Surgical History:  Procedure Laterality Date  . COLONOSCOPY  2004   Dr. Kalman Drape, [facility], normal  . TONSILLECTOMY    . VAGINAL DELIVERY     Family History  Problem Relation Age of Onset  . Cancer Mother     cervical and melanoma  . Thyroid nodules Mother   . Cancer Father     prostate cancer  . Heart disease Father     s/p CABG  . Cancer Sister     melanoma  . Heart disease Maternal Grandmother   . Heart disease Paternal Grandmother   . Heart disease Paternal Grandfather   . Miscarriages / Stillbirths Paternal Grandfather       ALLERGIES: Review of patient's allergies indicates no known allergies.  Current Outpatient Prescriptions on File Prior to Visit  Medication Sig Dispense Refill  . atorvastatin (LIPITOR) 20 MG tablet Take 1 tablet (20 mg total) by mouth  daily. 90 tablet 3  . calcium carbonate (OS-CAL) 600 MG TABS Take 600 mg by mouth daily.      . Cholecalciferol (VITAMIN D3) 1000 UNITS CAPS Take 1 capsule by mouth daily.      . clorazepate (TRANXENE) 7.5 MG tablet 1.5 tablets by mouth  daily 45 tablet 4  . dexamethasone 0.5 MG/5ML elixir Take 10 mLs (1 mg total) by mouth daily as needed. 100 mL 0  . fish oil-omega-3 fatty acids 1000 MG capsule Take 1 g by mouth daily.     . Multiple Vitamin (MULTIVITAMIN) capsule Take 1 capsule by mouth daily.       No current facility-administered medications on file prior to visit.     Social History  Substance Use Topics  . Smoking status: Never Smoker  . Smokeless tobacco: Never Used  . Alcohol use No    Review of Systems  Constitutional: Negative for chills, fever and unexpected weight change.  HENT: Negative for congestion.   Respiratory: Negative for cough.   Cardiovascular: Negative for chest pain, palpitations and leg swelling.  Gastrointestinal: Negative for nausea and vomiting.  Musculoskeletal: Negative for arthralgias and myalgias.  Skin: Negative for rash.  Neurological: Negative for headaches.  Hematological: Negative for adenopathy.  Psychiatric/Behavioral: Negative for confusion.      Objective:  BP 112/76   Pulse 75   Temp 97.8 F (36.6 C) (Oral)   Ht 5\' 4"  (1.626 m)   Wt 131 lb 9.6 oz (59.7 kg)   SpO2 98%   BMI 22.59 kg/m   BP Readings from Last 3 Encounters:  10/08/16 112/76  09/21/16 116/76  09/26/15 94/65   Wt Readings from Last 3 Encounters:  10/08/16 131 lb 9.6 oz (59.7 kg)  09/21/16 131 lb (59.4 kg)  09/26/15 132 lb (59.9 kg)    Physical Exam  Constitutional: She appears well-developed and well-nourished.  Eyes: Conjunctivae are normal.  Neck: No thyroid mass and no thyromegaly present.  Cardiovascular: Normal rate, regular rhythm, normal heart sounds and normal pulses.   Pulmonary/Chest: Effort normal and breath sounds normal. She has no  wheezes. She has no rhonchi. She has no rales. Right breast exhibits no inverted nipple, no mass, no nipple discharge, no skin change and no tenderness. Left breast exhibits no inverted nipple, no mass, no nipple discharge, no skin change and no tenderness. Breasts are symmetrical.  CBE performed.   Lymphadenopathy:       Head (right side): No submental, no submandibular, no tonsillar, no preauricular, no posterior auricular and no occipital adenopathy present.       Head (left side): No submental, no submandibular, no tonsillar, no preauricular, no posterior auricular and no occipital adenopathy present.    She has no cervical adenopathy.       Right cervical: No superficial cervical, no deep cervical and no posterior cervical adenopathy present.      Left cervical: No superficial cervical, no deep cervical and no posterior cervical adenopathy present.    She has no axillary adenopathy.  Neurological: She is alert.  Skin: Skin is warm and dry.  Psychiatric: She has a normal mood and affect. Her speech is normal and behavior is normal. Thought content normal.  Vitals reviewed.      Assessment & Plan:   Problem List Items Addressed This Visit      Musculoskeletal and Integument   Osteopenia    Pending DEXA. On calcium. Advised to take less supplemental Ca.         Other   Anxiety    Stable on current dose. Discussed risks of BZDs and patient agreed with long term plan of decreasing/discontinuing. Patient uses 3 times per week and we are using this as benchmark. If she goes over this, we will consider Zoloft. New CSC.        Routine physical examination - Primary    Patient is up-to-date on anoscopy. She is due for mammogram and that has been ordered. Patient understands to schedule. Also due for DEXA which has been ordered. Pneumococcal vaccine given today. Tetanus vaccine ordered and patient may have this done at a pharmacy. Screening labs are to been done and these were discussed  with patient at length today. Offered thyroid US due to mother's h/o of nodules; she declined [place] today. Encouraged continued exercise.      Relevant Medications   pneumococcal 23 valent vaccine (PNEUMOVAX 23) 25 MCG/0.5ML injection   Tdap (BOOSTRIX) 5-2.5-18.5 LF-MCG/0.5 injection   Other Relevant Orders   MM DIGITAL SCREENING BILATERAL   DG Bone Density    Other Visit Diagnoses    Encounter for screening mammogram for breast cancer       Relevant Orders   MM Digital Screening       I am having Ms. Trosper start on pneumococcal 23 valent vaccine  and Tdap. I am also having her maintain her multivitamin, calcium carbonate, Vitamin D3, fish oil-omega-3 fatty acids, dexamethasone, atorvastatin, and clorazepate.   Meds ordered this encounter  Medications  . pneumococcal 23 valent vaccine (PNEUMOVAX 23) 25 MCG/0.5ML injection    Sig: Inject 0.5 mLs into the muscle once.    Dispense:  0.5 mL    Refill:  0    Order Specific Question:   Supervising Provider    Answer:   Deborra Medina L [[id number]]  . Tdap (BOOSTRIX) 5-2.5-18.5 LF-MCG/0.5 injection    Sig: Inject 0.5 mLs into the muscle once.    Dispense:  0.5 mL    Refill:  0    Order Specific Question:   Supervising Provider    Answer:   Crecencio Mc [[id number]]    Return precautions given.   Risks, benefits, and alternatives of the medications and treatment plan prescribed today were discussed, and patient expressed understanding.   Education regarding symptom management and diagnosis given to patient on AVS.   Continue to follow with Mable Paris, FNP for routine health maintenance.   Kiara Donovan and I agreed with plan.   Mable Paris, FNP

## 2016-10-08 NOTE — Assessment & Plan Note (Signed)
Pending DEXA. On calcium. Advised to take less supplemental Ca.

## 2016-10-08 NOTE — Progress Notes (Signed)
Pre visit review using our clinic review tool, if applicable. No additional management support is needed unless otherwise documented below in the visit note. 

## 2016-10-08 NOTE — Assessment & Plan Note (Addendum)
Patient is up-to-date on anoscopy. She is due for mammogram and that has been ordered. Patient understands to schedule. Also due for DEXA which has been ordered. Pneumococcal vaccine given today. Tetanus vaccine ordered and patient may have this done at a pharmacy. Screening labs are to been done and these were discussed with patient at length today. Offered thyroid US due to mother's h/o of nodules; she declined [place] today. Encouraged continued exercise.

## 2016-10-20 LAB — FUNGUS CULTURE W SMEAR

## 2016-10-21 DIAGNOSIS — C801 Malignant (primary) neoplasm, unspecified: Secondary | ICD-10-CM

## 2016-10-21 HISTORY — DX: Malignant (primary) neoplasm, unspecified: C80.1

## 2016-10-26 ENCOUNTER — Ambulatory Visit: Payer: Medicare Other

## 2016-10-26 ENCOUNTER — Other Ambulatory Visit: Payer: Self-pay | Admitting: *Deleted

## 2016-10-26 ENCOUNTER — Telehealth: Payer: Self-pay | Admitting: Family

## 2016-10-26 ENCOUNTER — Ambulatory Visit (INDEPENDENT_AMBULATORY_CARE_PROVIDER_SITE_OTHER): Payer: Medicare Other | Admitting: Podiatry

## 2016-10-26 ENCOUNTER — Encounter: Payer: Self-pay | Admitting: Podiatry

## 2016-10-26 DIAGNOSIS — L603 Nail dystrophy: Secondary | ICD-10-CM | POA: Diagnosis not present

## 2016-10-26 DIAGNOSIS — Q828 Other specified congenital malformations of skin: Secondary | ICD-10-CM

## 2016-10-26 DIAGNOSIS — M79674 Pain in right toe(s): Secondary | ICD-10-CM

## 2016-10-26 NOTE — Telephone Encounter (Signed)
Pt called back returning your call. Thank you!  Call pt@ [phone]

## 2016-10-26 NOTE — Telephone Encounter (Signed)
Left message for patient to return call back.  

## 2016-10-26 NOTE — Progress Notes (Signed)
   Subjective:    Patient ID: Kiara Donovan, female    DOB: 1951-10-14, 65 y.o.   MRN: [id number]  HPI: She presents today concerned of a colon her toenail third digit of the right foot. I think he comes from the polish use.    Review of Systems  All other systems reviewed and are negative.      Objective:   Physical Exam vital signs are stable she is alert and oriented 3. Pulses are palpable. Neurologic services intact. Deep tendon reflexes are intact. Muscle strength is normal bilateral. Orthopedic evaluation was resolved with systems and ankle range of motion without crepitation. Cutaneous evaluation demonstrates supple well-hydrated cutis she does have a nail dystrophy to the third digit of the right foot which wore the likely go ahead and grow out it looks like it associated with possible nail polish or manipulation.        Assessment & Plan:  Nail dystrophy third digit right foot.  Plan: Follow up with me as needed.

## 2016-10-26 NOTE — Telephone Encounter (Signed)
Please call and MAIL-  Ms Hylton,  Your dexa scan shows osteoporosis.   Guidelines support starting medication therapy such as Fosamax.  You may discuss with Dr Caryl Bis who you are establishing care with in December and decide how you want to treat moving forward.    We need to follow this and monitor again in 2 more years to see if improved or worsened with another DEXA scan.  For post menopausal women, guidelines recommend a diet with 1200 mg of Calcium per day. If you are eating calcium rich foods, you do not need a calcium supplement. The body better absorbs the calcium that you eat over supplementation. If you do supplement, I recommend not supplementing the full 1200 mg/ day as this can lead to increased risk of cardiovascular disease. I recommend Calcium Citrate over the counter, and you may take a total of 600 to 800 mg per day in divided doses with meals for best absorption.   For bone health, you need adequate vitamin D, and I recommend you supplement as it is harder to do so with diet alone. I recommend cholecalciferol 800 units daily.  Also, please ensure you are following a diet high in calcium -- research shows better outcomes with dietary sources including kale, yogurt, broccolii, cheese, okra, almonds- to name a few.     Also remember that exercise is a great medicine for maintain and preserve bone health. Advise moderate exercise for 30 minutes , 3 times per week.    Joycelyn Schmid, NP

## 2016-10-27 NOTE — Telephone Encounter (Signed)
Patient has been informed.

## 2016-11-23 ENCOUNTER — Ambulatory Visit: Payer: Medicare Other | Admitting: Podiatry

## 2016-11-30 ENCOUNTER — Encounter: Payer: Self-pay | Admitting: Family Medicine

## 2016-11-30 ENCOUNTER — Ambulatory Visit (INDEPENDENT_AMBULATORY_CARE_PROVIDER_SITE_OTHER): Payer: Medicare Other | Admitting: Family Medicine

## 2016-11-30 VITALS — BP 102/80 | HR 98 | Temp 98.0°F | Ht 63.0 in | Wt 126.8 lb

## 2016-11-30 DIAGNOSIS — M810 Age-related osteoporosis without current pathological fracture: Secondary | ICD-10-CM

## 2016-11-30 DIAGNOSIS — K149 Disease of tongue, unspecified: Secondary | ICD-10-CM | POA: Insufficient documentation

## 2016-11-30 DIAGNOSIS — D0512 Intraductal carcinoma in situ of left breast: Secondary | ICD-10-CM

## 2016-11-30 DIAGNOSIS — F419 Anxiety disorder, unspecified: Secondary | ICD-10-CM

## 2016-11-30 DIAGNOSIS — D0510 Intraductal carcinoma in situ of unspecified breast: Secondary | ICD-10-CM | POA: Insufficient documentation

## 2016-11-30 MED ORDER — CLORAZEPATE DIPOTASSIUM 7.5 MG PO TABS
ORAL_TABLET | ORAL | 2 refills | Status: DC
Start: 2016-11-30 — End: 2017-12-24

## 2016-11-30 NOTE — Progress Notes (Signed)
Tommi Rumps, MD Phone: [phone]  Kiara Donovan is a 65 y.o. female who presents today for follow-up.  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 Second surgery got tumor free margins. Is following with radiation oncology, oncology, and surgery at [facility]. Sees radiation oncology tomorrow for CT scan and mammography. Notes they advised her that there would be 4 weeks of radiation. She reports the surgical site is healing well. She is not looking forward to having another mammogram.  Patient notes her anxiety has been quite a bit worse since finding out she had DCIS. She's been taking her clorazepate most days out of the week. She takes 1-1.5 tablets daily. It is prescribed for her to take 1.5 tablets daily. She did sign a controlled substance agreement at her last visit with our nurse practitioner. She reports she is sad regarding her diagnosis though not depressed. No she is scared of the potential outcomes in the future.  Patient notes tongue irritation that has been previously evaluated by ENT and our nurse practitioner. 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 Notes she saw an ear nose and throat physician following her most recent visit with our nurse practitioner in October and they advised that there is not much to do for it. She does not smoke. Rare alcohol use in the distant past.  Osteoporosis: Noted on recent DEXA scan. Was not taking calcium or vitamin D.  PMH: nonsmoker.   ROS see history of present illness  Objective  Physical Exam Vitals:   11/30/16 1320  BP: 102/80  Pulse: 98  Temp: 98 F (36.7 C)    BP Readings from Last 3 Encounters:  11/30/16 102/80  10/08/16 112/76  09/21/16 116/76   Wt Readings from Last 3 Encounters:  11/30/16 126 lb 12.8 oz (57.5 kg)  10/08/16 131 lb 9.6 oz (59.7 kg)  09/21/16 131 lb (59.4 kg)    Physical Exam  Constitutional: No distress.  HENT:  Head: Normocephalic and  atraumatic.  Mouth/Throat: Oropharynx is clear and moist. No oropharyngeal exudate.  Possible mild irritation of the tongue, no obvious individual lesions, no palpable lesions in the anterior tongue  Cardiovascular: Normal rate, regular rhythm and normal heart sounds.   Pulmonary/Chest: Effort normal and breath sounds normal.  Left breast scar just superior to the nipple well healing with surgical glue over top of the scar, no surrounding erythema, no tenderness, no fluctuance  Musculoskeletal: She exhibits no edema.  Skin: She is not diaphoretic.  Psychiatric:  Mood anxious, affect anxious     Assessment/Plan: Please see individual problem list.  Anxiety Has had worsened anxiety over the last month given recent DCIS diagnosis. Has been using her clorazepate most days of the week with good benefit. I discussed potential other treatments such as SSRI or referral to counselor for further help with this though given the somewhat new nature of her DCIS diagnosis we opted for continued clorazepate and continuing to monitor. She will follow-up in 2 months for this issue.  Osteoporosis Noted on DEXA scan. Could benefit from treatment with this. Patient does report a history of a gastric ulcer though no reflux. Given this we will research options for treatment for the patient given her recent DCIS diagnosis. Advised patient that if she does not hear back regarding this in the next week she should contact us.  Tongue irritation Possibly mild irritation noted on exam. No overt masses or lesions noted. 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 Given persistent symptoms we'll refer to ENT at [facility] for a second opinion.  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 Getting ready to start radiation. Did discuss some of the aspects of her radiation treatment regarding CT scanning and planning to limit exposure of other tissues to the radiation. I offered encouragement and  discussed that she is in good hands with Dr. Luetta Nutting at [facility] for radiation. She will continue to follow with them and oncology and surgery at [facility]. Discussed infection precautions regarding her incision and scar.   Orders Placed This Encounter  Procedures  . Ambulatory referral to ENT    Referral Priority:   Routine    Referral Type:   Consultation    Referral Reason:   Specialty Services Required    Requested Specialty:   Otolaryngology    Number of Visits Requested:   1    Meds ordered this encounter  Medications  . clorazepate (TRANXENE) 7.5 MG tablet    Sig: 1.5 tablets by mouth daily prn    Dispense:  45 tablet    Refill:  2    Tommi Rumps, MD [place]

## 2016-11-30 NOTE — Patient Instructions (Signed)
Nice to see you. We will get you to see a ear nose and throat physician for second opinion on your tongue. You can continue the clorazepate for anxiety. If you feel like your anxiety worsens please let us know as we can try other medications in addition to the clorazepate or heavy see a counselor. If you felt any depression, or pain, redness, swelling, or drainage from your surgical site incision please seek medical attention.

## 2016-11-30 NOTE — Assessment & Plan Note (Signed)
Has had worsened anxiety over the last month given recent DCIS diagnosis. Has been using her clorazepate most days of the week with good benefit. I discussed potential other treatments such as SSRI or referral to counselor for further help with this though given the somewhat new nature of her DCIS diagnosis we opted for continued clorazepate and continuing to monitor. She will follow-up in 2 months for this issue.

## 2016-11-30 NOTE — Assessment & Plan Note (Signed)
Noted on DEXA scan. Could benefit from treatment with this. Patient does report a history of a gastric ulcer though no reflux. Given this we will research options for treatment for the patient given her recent DCIS diagnosis. Advised patient that if she does not hear back regarding this in the next week she should contact us.

## 2016-11-30 NOTE — Progress Notes (Signed)
Pre visit review using our clinic review tool, if applicable. No additional management support is needed unless otherwise documented below in the visit note. 

## 2016-11-30 NOTE — Assessment & Plan Note (Signed)
Possibly mild irritation noted on exam. No overt masses or lesions noted. 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 Given persistent symptoms we'll refer to ENT at [facility] for a second opinion.

## 2016-11-30 NOTE — Assessment & Plan Note (Signed)
Status post surgery. Getting ready to start radiation. Did discuss some of the aspects of her radiation treatment regarding CT scanning and planning to limit exposure of other tissues to the radiation. I offered encouragement and discussed that she is in good hands with Dr. Luetta Nutting at [facility] for radiation. She will continue to follow with them and oncology and surgery at [facility]. Discussed infection precautions regarding her incision and scar.

## 2016-12-13 ENCOUNTER — Emergency Department
Admission: EM | Admit: 2016-12-13 | Discharge: 2016-12-13 | Disposition: A | Discharge status: Home / Self Care | Payer: Medicare Other | Attending: Emergency Medicine | Admitting: Emergency Medicine

## 2016-12-13 ENCOUNTER — Emergency Department: Payer: Medicare Other

## 2016-12-13 ENCOUNTER — Encounter: Payer: Self-pay | Admitting: Emergency Medicine

## 2016-12-13 DIAGNOSIS — W1839XA Other fall on same level, initial encounter: Secondary | ICD-10-CM | POA: Diagnosis not present

## 2016-12-13 DIAGNOSIS — Y929 Unspecified place or not applicable: Secondary | ICD-10-CM | POA: Diagnosis not present

## 2016-12-13 DIAGNOSIS — Y999 Unspecified external cause status: Secondary | ICD-10-CM | POA: Diagnosis not present

## 2016-12-13 DIAGNOSIS — S022XXA Fracture of nasal bones, initial encounter for closed fracture: Secondary | ICD-10-CM | POA: Insufficient documentation

## 2016-12-13 DIAGNOSIS — Z79899 Other long term (current) drug therapy: Secondary | ICD-10-CM | POA: Insufficient documentation

## 2016-12-13 DIAGNOSIS — Y939 Activity, unspecified: Secondary | ICD-10-CM | POA: Insufficient documentation

## 2016-12-13 DIAGNOSIS — Z853 Personal history of malignant neoplasm of breast: Secondary | ICD-10-CM | POA: Diagnosis not present

## 2016-12-13 DIAGNOSIS — R55 Syncope and collapse: Secondary | ICD-10-CM | POA: Diagnosis present

## 2016-12-13 HISTORY — DX: Malignant (primary) neoplasm, unspecified: C80.1

## 2016-12-13 LAB — BASIC METABOLIC PANEL
Anion gap: 7 (ref 5–15)
BUN: 21 mg/dL — AB (ref 6–20)
CALCIUM: 9 mg/dL (ref 8.9–10.3)
CHLORIDE: 104 mmol/L (ref 101–111)
CO2: 27 mmol/L (ref 22–32)
CREATININE: 0.86 mg/dL (ref 0.44–1.00)
GFR calc Af Amer: >60 mL/min (ref >60)
GFR calc non Af Amer: >60 mL/min (ref >60)
GLUCOSE: 148 mg/dL — AB (ref 65–99)
Potassium: 3.6 mmol/L (ref 3.5–5.1)
Sodium: 138 mmol/L (ref 135–145)

## 2016-12-13 LAB — URINALYSIS, COMPLETE (UACMP) WITH MICROSCOPIC
Bilirubin Urine: NEGATIVE
Glucose, UA: NEGATIVE mg/dL
Hgb urine dipstick: NEGATIVE
KETONES UR: 5 mg/dL — AB
Leukocytes, UA: NEGATIVE
Nitrite: NEGATIVE
PH: 5 (ref 5.0–8.0)
Protein, ur: NEGATIVE mg/dL
Specific Gravity, Urine: 1.016 (ref 1.005–1.030)

## 2016-12-13 LAB — TROPONIN I: Troponin I: <0.03 ng/mL (ref <0.03)

## 2016-12-13 LAB — CBC
HCT: 37.4 % (ref 35.0–47.0)
Hemoglobin: 12.7 g/dL (ref 12.0–16.0)
MCH: 31.4 pg (ref 26.0–34.0)
MCHC: 33.8 g/dL (ref 32.0–36.0)
MCV: 92.7 fL (ref 80.0–100.0)
PLATELETS: 172 10*3/uL (ref 150–440)
RBC: 4.04 MIL/uL (ref 3.80–5.20)
RDW: 13.9 % (ref 11.5–14.5)
WBC: 6.9 10*3/uL (ref 3.6–11.0)

## 2016-12-13 MED ORDER — ACETAMINOPHEN 500 MG PO TABS
ORAL_TABLET | ORAL | Status: AC
  Administered 2016-12-13: 1000 mg via ORAL
  Filled 2016-12-13: qty 2

## 2016-12-13 MED ORDER — ACETAMINOPHEN 500 MG PO TABS
1000.0000 mg | ORAL_TABLET | Freq: Once | ORAL | Status: AC
  Administered 2016-12-13: 1000 mg via ORAL

## 2016-12-13 NOTE — ED Notes (Signed)
Pt taken to MRI by Tammy, EDT-P.

## 2016-12-13 NOTE — ED Provider Notes (Signed)
MRI unremarkable. Patient feels well and back to baseline. We'll discharge home.   Carrie Mew, MD 12/13/16 [phone]

## 2016-12-13 NOTE — ED Provider Notes (Signed)
[facility] Emergency Department Provider Note   ____________________________________________   First MD Initiated Contact with Patient 12/13/16 [phone]     (approximate)  I have reviewed the triage vital signs and the nursing notes.   HISTORY  Chief Complaint Loss of Consciousness    HPI Kiara Donovan is a 65 y.o. female who comes into the hospital today with a syncopal episode. The patient reports that she got up to gargle with salt water for a scratchy throat and she doesn't remember falling. She reports that she ended up on the floor. She was up on minute and She knew she was getting up off the floor. The patient denies any chest pain, shortness of breath, blurred vision. She does have some facial pain. The patient is unsure exactly how much time she was unconscious for as well saw her. The patient has passed out like this before. She reports that happened about a year ago and then previously multiple years before that. The patient has never found a reason for this syncopal episode. The patient was recently diagnosed with breast cancer but has not yet undergone any treatment. The patient is here today for evaluation.   Past Medical History:  Diagnosis Date  . Cancer ([place]) 10/2016   left breast lumpectomy  . Rheumatic disease   . Vestibulitis, vulvar    seen at [facility]    Patient Active Problem List   Diagnosis Date Noted  . Tongue irritation 11/30/2016  . 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 Medicare annual wellness visit, initial 09/26/2015  . Familial hyperlipidemia 10/22/2014  . Routine physical examination 09/12/2013  . Edema, lower extremity 09/12/2013  . Screening for colon cancer 09/12/2013  . Hemorrhoid 09/07/2012  . Screening for breast cancer 09/07/2012  . Anxiety 09/05/2011  . Osteoporosis 09/05/2011    Past Surgical History:  Procedure Laterality Date  . COLONOSCOPY  2004   Dr. Kalman Drape, [facility], normal    . TONSILLECTOMY    . VAGINAL DELIVERY      Prior to Admission medications   Medication Sig Start Date End Date Taking? Authorizing Provider  atorvastatin (LIPITOR) 20 MG tablet Take 1 tablet (20 mg total) by mouth daily. 06/26/16   Coral Spikes, DO  calcium carbonate (OS-CAL) 600 MG TABS Take 600 mg by mouth daily.      Historical Provider, MD  Cholecalciferol (VITAMIN D3) 1000 UNITS CAPS Take 1 capsule by mouth daily.      Historical Provider, MD  clorazepate (TRANXENE) 7.5 MG tablet 1.5 tablets by mouth daily prn 11/30/16   Leone Haven, MD  dexamethasone 0.5 MG/5ML elixir Take 10 mLs (1 mg total) by mouth daily as needed. 09/26/15   Jackolyn Confer, MD  fish oil-omega-3 fatty acids 1000 MG capsule Take 1 g by mouth daily.     Historical Provider, MD  Multiple Vitamin (MULTIVITAMIN) capsule Take 1 capsule by mouth daily.      Historical Provider, MD    Allergies Patient has no known allergies.  Family History  Problem Relation Age of Onset  . Cancer Mother     cervical and melanoma  . Thyroid nodules Mother   . Cancer Father     prostate cancer  . Heart disease Father     s/p CABG  . Cancer Sister     melanoma  . Heart disease Maternal Grandmother   . Heart disease Paternal Grandmother   . Heart disease Paternal Grandfather   .  Miscarriages / Stillbirths Paternal Grandfather     Social History Social History  Substance Use Topics  . Smoking status: Never Smoker  . Smokeless tobacco: Never Used  . Alcohol use No    Review of Systems Constitutional: No fever/chills Eyes: No visual changes. ENT: Facial pain Cardiovascular: Denies chest pain. Respiratory: Denies shortness of breath. Gastrointestinal: No abdominal pain.  No nausea, no vomiting.  No diarrhea.  No constipation. Genitourinary: Negative for dysuria. Musculoskeletal: Negative for back pain. Skin: Negative for rash. Neurological: Syncope  10-point ROS otherwise  negative.  ____________________________________________   PHYSICAL EXAM:  VITAL SIGNS: ED Triage Vitals  Enc Vitals Group     BP 12/13/16 0230 136/80     Pulse Rate 12/13/16 0230 72     Resp 12/13/16 0230 20     Temp 12/13/16 0230 97.9 F (36.6 C)     Temp Source 12/13/16 0230 Oral     SpO2 12/13/16 0230 100 %     Weight 12/13/16 0231 124 lb (56.2 kg)     Height 12/13/16 0231 5\' 3"  (1.6 m)     Head Circumference --      Peak Flow --      Pain Score 12/13/16 0231 6     Pain Loc --      Pain Edu? --      Excl. in [place]? --     Constitutional: Alert and oriented. Well appearing and in Mild distress. Eyes: Conjunctivae are normal. PERRL. EOMI. Head: Atraumatic. Nose: No congestion/rhinnorhea. Mouth/Throat: Mucous membranes are moist.  Oropharynx non-erythematous. Cardiovascular: Normal rate, regular rhythm. Grossly normal heart sounds.  Good peripheral circulation. Respiratory: Normal respiratory effort.  No retractions. Lungs CTAB. Gastrointestinal: Soft and nontender. No distention. Positive bowel sounds Musculoskeletal: No lower extremity tenderness nor edema.   Neurologic:  Normal speech and language. Cranial nerves II through XII are grossly intact with no focal motor or neuro deficits aside from some mild right-sided facial droop. Skin:  Skin is warm, dry and intact.  Psychiatric: Mood and affect are normal.   ____________________________________________   LABS (all labs ordered are listed, but only abnormal results are displayed)  Labs Reviewed  BASIC METABOLIC PANEL - Abnormal; Notable for the following:       Result Value   Glucose, Bld 148 (*)    BUN 21 (*)    All other components within normal limits  URINALYSIS, COMPLETE (UACMP) WITH MICROSCOPIC - Abnormal; Notable for the following:    Color, Urine YELLOW (*)    APPearance CLEAR (*)    Ketones, ur 5 (*)    Bacteria, UA RARE (*)    Squamous Epithelial / LPF 0-5 (*)    All other components within normal  limits  CBC  TROPONIN I  TROPONIN I  CBG MONITORING, ED   ____________________________________________  EKG  ED ECG REPORT I, Loney Hering, the attending physician, personally viewed and interpreted this ECG.   Date: 12/13/2016  EKG Time: 227  Rate: 78  Rhythm: normal sinus rhythm  Axis: normal  Intervals:none  ST&T Change: none  ____________________________________________  RADIOLOGY  CT head, cervical spine and max face  MRI/MRA head ____________________________________________   PROCEDURES  Procedure(s) performed: None  Procedures  Critical Care performed: No  ____________________________________________   INITIAL IMPRESSION / ASSESSMENT AND PLAN / ED COURSE  Pertinent labs & imaging results that were available during my care of the patient were reviewed by me and considered in my medical decision making (see chart  for details).  This is a 65 year old female who comes into the hospital today with a syncopal episode. The patient does have what appears to be a mild right-sided facial droop but she has no numbness and no weakness. The patient also has no unsteady gait. Her initial workup is unremarkable regarding her blood work. I will repeat the patient's troponin. I will also order an MRI and an MRA of the patient's head looking for possible stroke.  Clinical Course as of Dec 13 734  Sun Dec 13, 2016  [id number] No acute intracranial hemorrhage.  No acute/ traumatic cervical spine pathology.  Mildly displaced fracture of the right nasal bone   CT Head Wo Contrast [AW]    Clinical Course User Index [AW] Loney Hering, MD   The patient's blood work is unremarkable. The patient will receive an MRI for evaluation of the syncope and the facial drop. The patient will be signed out to Dr Joni Fears who will follow up the results of the MRI and disposition the patient.   ____________________________________________   FINAL CLINICAL IMPRESSION(S) / ED  DIAGNOSES  Final diagnoses:  Syncope, unspecified syncope type      NEW MEDICATIONS STARTED DURING THIS VISIT:  New Prescriptions   No medications on file     Note:  This document was prepared using Dragon voice recognition software and may include unintentional dictation errors.    Loney Hering, MD 12/13/16 [phone]

## 2016-12-13 NOTE — ED Notes (Signed)
This RN introduced self to patient and husband. This RN assisted patient to the bathroom, pt refused to use bathroom in the room, pt ambulated without difficulty to restroom. Pt is alert and oriented. Voices fears to this RN about MRI. This RN attempted to calm patient, patient assisted back into bed and placed back on the monitor at this time.

## 2016-12-13 NOTE — ED Notes (Signed)
This RN assisted patient to the bathroom. Pt ambulated without difficulty. Delay explained to patient. Pt is alert and oriented at this time. Pt's husband remains at bedside at this time.

## 2016-12-13 NOTE — ED Triage Notes (Signed)
Patient presents to Emergency Department via EMS with complaints of syncope.  Pt was going to bathroom to gargle saltwater for a scratchy throat  when she got dizzy and woke up in the floor.  Pt not on blood thinners and doesn't remember the fall.   Pt with contusion to the bridge of nose and lec to forehead.  HA and nose  pain 6/10

## 2016-12-13 NOTE — ED Notes (Signed)
This RN to bedside. Pt appears to be more calm with husband at bedside denies any needs at this time. Will continue to monitor for further patient needs.

## 2016-12-13 NOTE — ED Notes (Signed)
Patient transported to CT 

## 2016-12-13 NOTE — ED Notes (Signed)
This RN to bedside at this time. Pt resting in bed with her husband at bedside. Denies any needs at this time. Pt is alert and oriented, explained delay to patient. Pt states understanding. Will continue to monitor for further patient needs.

## 2016-12-13 NOTE — ED Notes (Signed)
Pt. Verbalizes understanding of d/c instructions and follow-up. VS stable and pain controlled per pt.  Pt. In NAD at time of d/c and denies further concerns regarding this visit. Pt. Stable at the time of departure from the unit, departing unit by the safest and most appropriate manner per that pt condition and limitations. Pt advised to return to the ED at any time for emergent concerns, or for new/worsening symptoms.   

## 2016-12-13 NOTE — ED Notes (Signed)
Pt requesting pain med, Dr Dahlia Client notified, EDP to see pt first

## 2016-12-13 NOTE — ED Notes (Signed)
Pt given Ascom to speak with MRI tech.

## 2016-12-13 NOTE — ED Notes (Addendum)
Pt returned from MRI at this time

## 2017-01-13 DIAGNOSIS — Z9889 Other specified postprocedural states: Secondary | ICD-10-CM | POA: Insufficient documentation

## 2017-01-13 DIAGNOSIS — S0993XS Unspecified injury of face, sequela: Secondary | ICD-10-CM | POA: Insufficient documentation

## 2017-01-13 DIAGNOSIS — J328 Other chronic sinusitis: Secondary | ICD-10-CM | POA: Insufficient documentation

## 2017-01-13 DIAGNOSIS — J342 Deviated nasal septum: Secondary | ICD-10-CM | POA: Insufficient documentation

## 2017-01-13 DIAGNOSIS — J343 Hypertrophy of nasal turbinates: Secondary | ICD-10-CM | POA: Insufficient documentation

## 2017-01-13 DIAGNOSIS — S022XXA Fracture of nasal bones, initial encounter for closed fracture: Secondary | ICD-10-CM | POA: Insufficient documentation

## 2017-03-08 ENCOUNTER — Telehealth: Payer: Self-pay | Admitting: Family Medicine

## 2017-03-08 NOTE — Telephone Encounter (Signed)
The patient called stating that she was seen in December at that time she was informed that she would benefit from treatments for Osteoporosis. The patient stated that she wanted to finish her radiation treatment due to breast cancer. She has finished her treatment and she is wanting to start her treatment for her osteoporosis.

## 2017-03-08 NOTE — Telephone Encounter (Signed)
Please advise, will she need an office visit?

## 2017-03-10 NOTE — Telephone Encounter (Signed)
I would suggest office visit to discuss further treatment. Thanks.

## 2017-03-11 NOTE — Telephone Encounter (Signed)
Patient is scheduled for follow up

## 2017-03-31 ENCOUNTER — Encounter: Payer: Self-pay | Admitting: Family Medicine

## 2017-03-31 ENCOUNTER — Ambulatory Visit (INDEPENDENT_AMBULATORY_CARE_PROVIDER_SITE_OTHER): Payer: Medicare Other | Admitting: Family Medicine

## 2017-03-31 VITALS — BP 110/80 | HR 86 | Temp 98.4°F | Wt 127.4 lb

## 2017-03-31 DIAGNOSIS — E784 Other hyperlipidemia: Secondary | ICD-10-CM | POA: Diagnosis not present

## 2017-03-31 DIAGNOSIS — D0512 Intraductal carcinoma in situ of left breast: Secondary | ICD-10-CM

## 2017-03-31 DIAGNOSIS — M810 Age-related osteoporosis without current pathological fracture: Secondary | ICD-10-CM | POA: Diagnosis not present

## 2017-03-31 DIAGNOSIS — R55 Syncope and collapse: Secondary | ICD-10-CM | POA: Diagnosis not present

## 2017-03-31 DIAGNOSIS — E7849 Other hyperlipidemia: Secondary | ICD-10-CM

## 2017-03-31 MED ORDER — ATORVASTATIN CALCIUM 20 MG PO TABS: 20.0000 mg | ORAL_TABLET | Freq: Every day | ORAL | 3 refills | Status: DC

## 2017-03-31 NOTE — Assessment & Plan Note (Signed)
She reports 3 episodes of syncope through the years though only one in recent years. She was evaluated in the emergency room for this prior to Christmas day. Evaluation was reassuring and she was discharged home without cardiology evaluation. She has not seen cardiology recently. Concern would be for potential cardiogenic cause given she had no preceding symptoms. She does report some lightheadedness at times and symptoms could potentially be related to orthostasis. Given her impending surgery for her nose we will have her evaluated by cardiology this Friday to determine if any further workup needs to be done.

## 2017-03-31 NOTE — Assessment & Plan Note (Signed)
Refill Lipitor

## 2017-03-31 NOTE — Progress Notes (Signed)
Tommi Rumps, MD Phone: [phone]  Kiara Donovan is a 66 y.o. female who presents today for follow-up.  Osteoporosis: Noted on DEXA scan previously. She does take calcium and vitamin D. She would like to discuss the options for treatment.  She finished treatment for her breast cancer. She is currently on anastrozole. Has had some nausea and hot flashes with this though continues to follow with her oncologist.  She reports a syncopal episode on Christmas Eve. She had gotten up and went to the sink and then just found herself on the floor. No prior symptoms. No chest pain or shortness breath or palpitations. She did hit her forehead and broke her nose. She underwent evaluation at [facility] and had an MRI that was unremarkable. EKG was reassuring. Lab work was reassuring as well. She does occasionally get lightheaded when she goes to stand up. She reports she has passed out on 2 occasions many years ago. She has not seen a cardiologist recently. She is following with the surgeon for her nasal fracture. She has not passed out since then.  She takes Lipitor daily. No chest pain or shortness of breath.  PMH: nonsmoker.   ROS see history of present illness  Objective  Physical Exam Vitals:   03/31/17 1556  BP: 110/80  Pulse: 86  Temp: 98.4 F (36.9 C)   Laying blood pressure 122/76 pulse 70 Sitting blood pressure 110/64 pulse 87  Standing blood pressure 104/66 pulse 89  BP Readings from Last 3 Encounters:  03/31/17 110/80  12/13/16 (!) 141/79  11/30/16 102/80   Wt Readings from Last 3 Encounters:  03/31/17 127 lb 6.4 oz (57.8 kg)  12/13/16 124 lb (56.2 kg)  11/30/16 126 lb 12.8 oz (57.5 kg)    Physical Exam  Constitutional: No distress.  Cardiovascular: Normal rate, regular rhythm and normal heart sounds.   Pulmonary/Chest: Effort normal and breath sounds normal.  Musculoskeletal: She exhibits no edema.  Neurological: She is alert. Gait normal.  Skin:  Skin is warm and dry. She is not diaphoretic.  Psychiatric:  Affect anxious     Assessment/Plan: Please see individual problem list.  Osteoporosis She was provided with information for Fosamax, Prolia, and reclast. I discussed potential risk of esophageal or gastric irritation with the Fosamax. She was provided with the information to review and she will contact us to let us know what she decides to do.  Familial hyperlipidemia Refill Lipitor.  DCIS (ductal carcinoma in situ) of breast She will continue to follow with her oncologist and surgeon.  Syncope She reports 3 episodes of syncope through the years though only one in recent years. She was evaluated in the emergency room for this prior to Christmas day. Evaluation was reassuring and she was discharged home without cardiology evaluation. She has not seen cardiology recently. Concern would be for potential cardiogenic cause given she had no preceding symptoms. She does report some lightheadedness at times and symptoms could potentially be related to orthostasis. Given her impending surgery for her nose we will have her evaluated by cardiology this Friday to determine if any further workup needs to be done.   Orders Placed This Encounter  Procedures  . Ambulatory referral to Cardiology    Referral Priority:   Routine    Referral Type:   Consultation    Referral Reason:   Specialty Services Required    Requested Specialty:   Cardiology    Number of Visits Requested:   1    Meds ordered  this encounter  Medications  . Calcium Carbonate-Vitamin D (CALCIUM 500 + D) 500-125 MG-UNIT TABS    Sig: Take by mouth.  Marland Kitchen atorvastatin (LIPITOR) 20 MG tablet    Sig: Take 1 tablet (20 mg total) by mouth daily.    Dispense:  90 tablet    Refill:  [place], MD [place]

## 2017-03-31 NOTE — Assessment & Plan Note (Signed)
She was provided with information for Fosamax, Prolia, and reclast. I discussed potential risk of esophageal or gastric irritation with the Fosamax. She was provided with the information to review and she will contact us to let us know what she decides to do.

## 2017-03-31 NOTE — Assessment & Plan Note (Signed)
She will continue to follow with her oncologist and surgeon.

## 2017-03-31 NOTE — Patient Instructions (Signed)
Nice to see you. Please review the information for the osteoporosis medicines and let us know what you would like to do. I refilled your Lipitor.

## 2017-03-31 NOTE — Progress Notes (Signed)
Pre visit review using our clinic review tool, if applicable. No additional management support is needed unless otherwise documented below in the visit note. 

## 2017-05-10 DIAGNOSIS — I6523 Occlusion and stenosis of bilateral carotid arteries: Secondary | ICD-10-CM | POA: Insufficient documentation

## 2017-09-29 IMAGING — US US SOFT TISSUE HEAD/NECK
1 series · 8 of 8 positions shown · non-contrast
Comparison: None.

CLINICAL DATA: Enlarged lymph nodes. Patient felt marble size a
lump above the right clavicle several weeks ago which has since
disappeared.

EXAM:
ULTRASOUND OF HEAD/NECK SOFT TISSUES
TECHNIQUE: Ultrasound examination of the head and neck soft tissues was
performed in the area of clinical concern.

[Series 1: us soft tissue head/neck · 0.07mm/px · 8 of 8 slices shown]
[im 1/8]
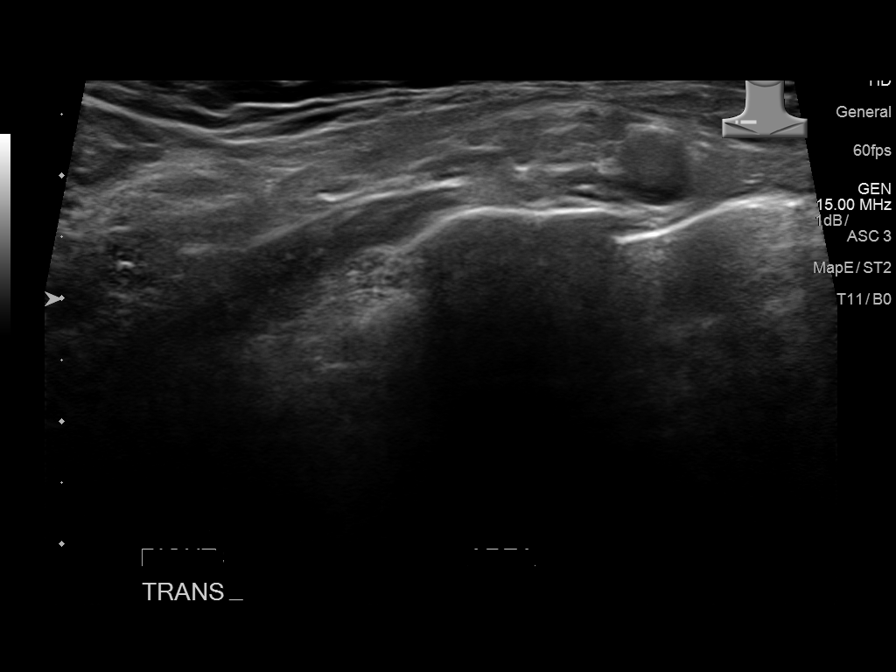
[im 2/8]
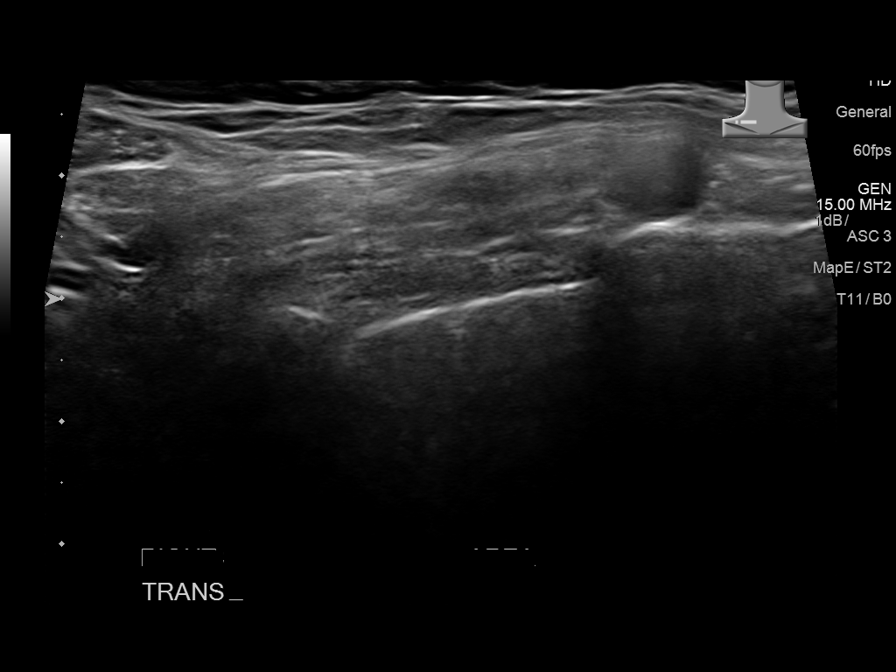
[im 3/8]
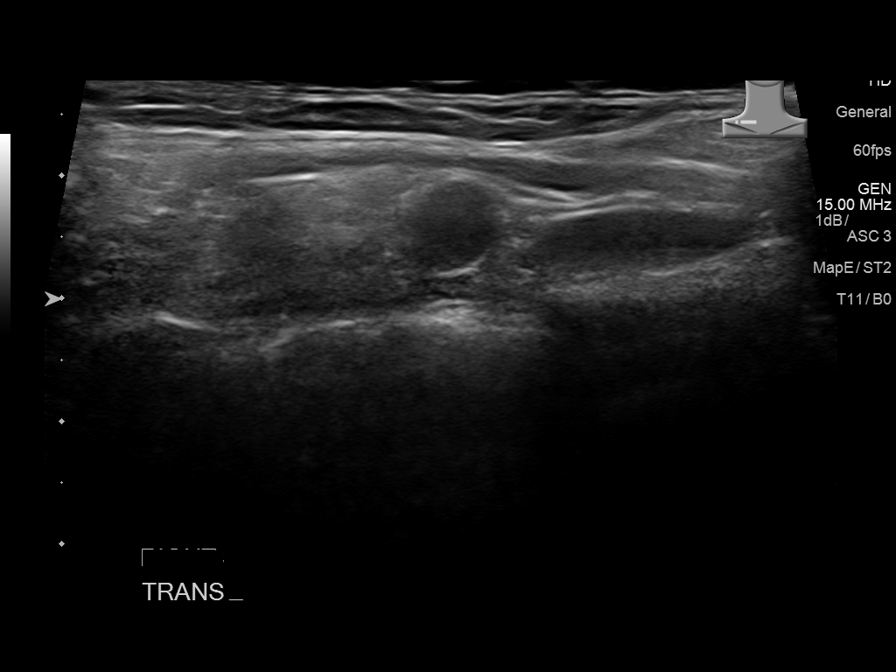
[im 4/8]
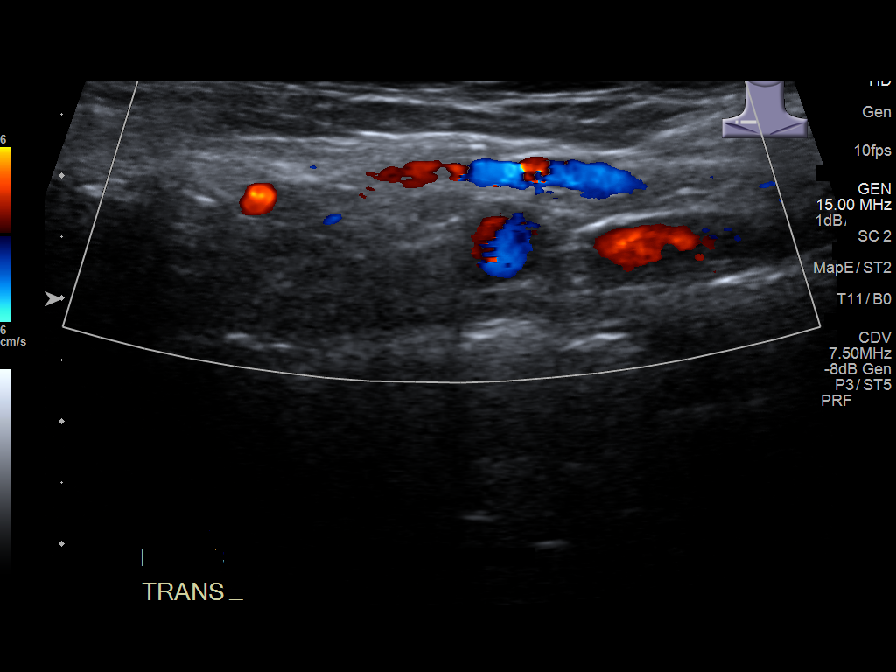
[im 5/8]
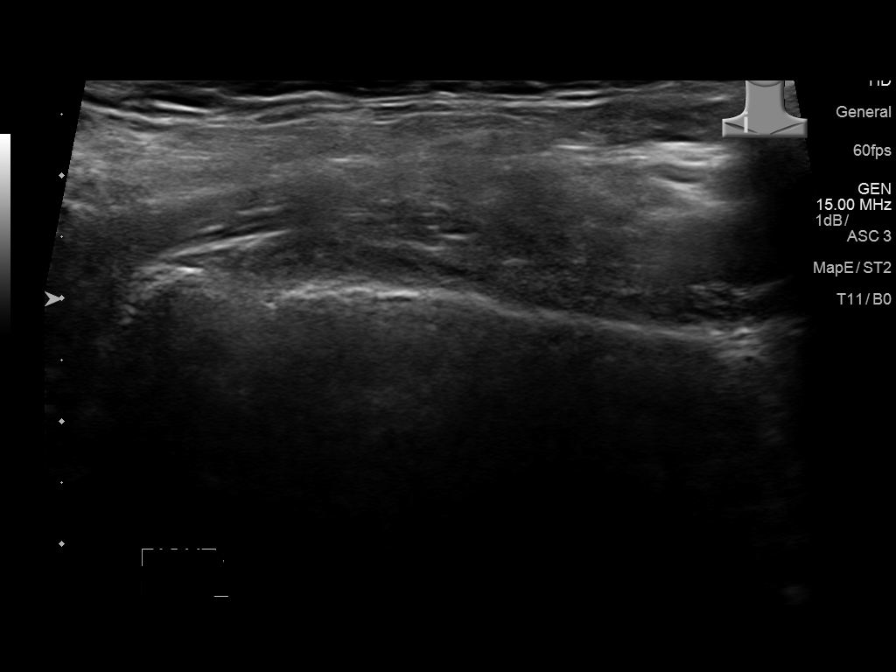
[im 6/8]
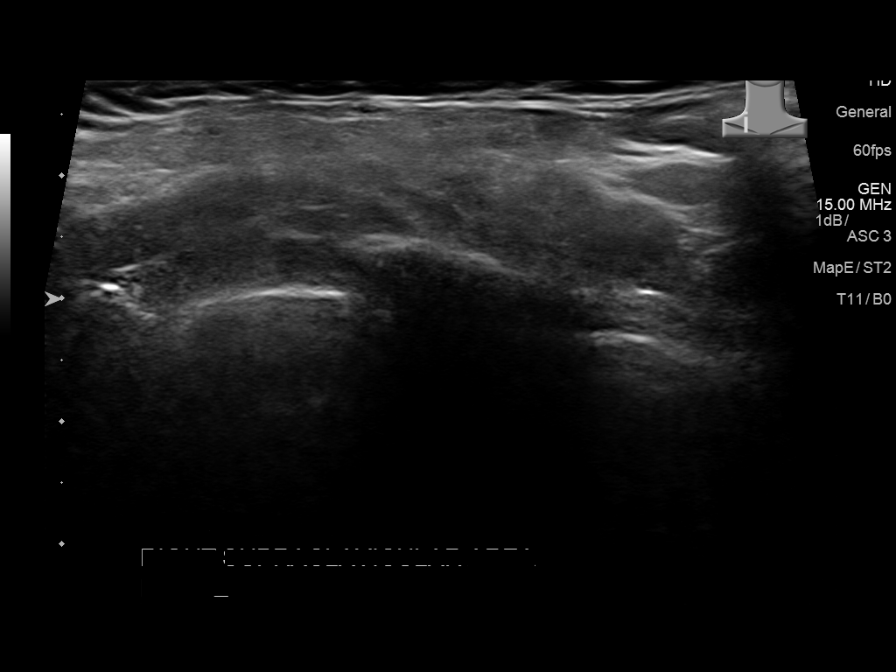
[im 7/8]
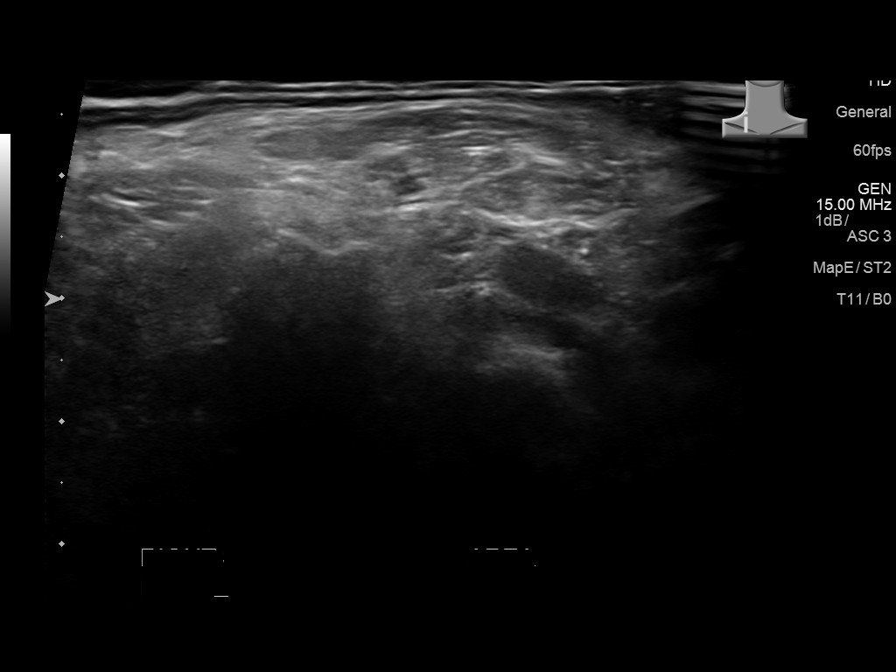
[im 8/8]
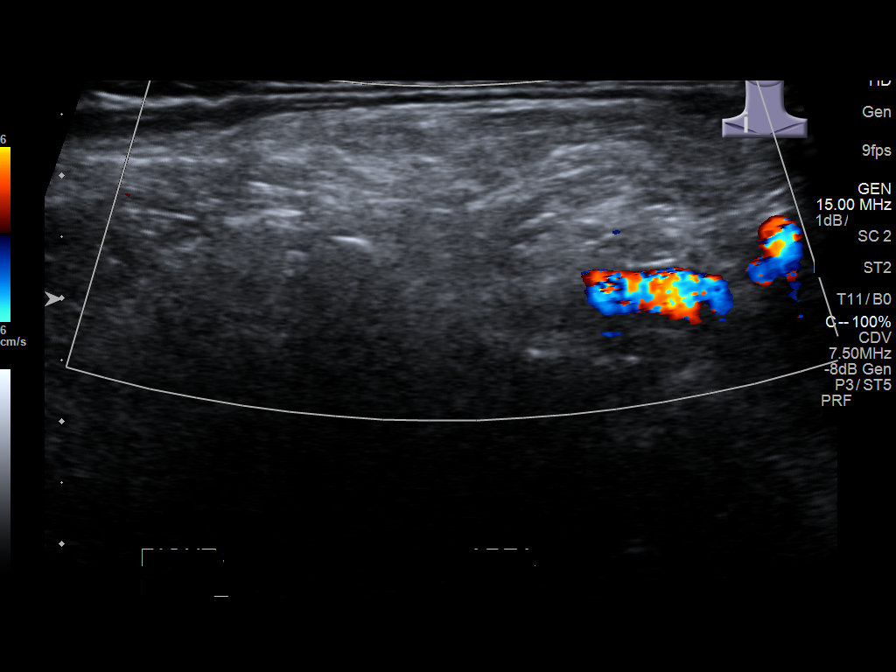

[8 of 8 positions shown; findings below may reference images not displayed]

FINDINGS: Provided grayscale and color Doppler ultrasound images of the
patient's palpable area of concern within the right supraclavicular
fossa are negative for discrete solid or cystic mass. Specifically,
no evidence of cervical or supraclavicular lymphadenopathy.
IMPRESSION: No sonographic correlate for the patient's palpable area of concern
within the right supraclavicular fossa. Specifically, no evidence of
cervical or supraclavicular adenopathy.

## 2017-12-24 ENCOUNTER — Other Ambulatory Visit: Payer: Self-pay

## 2017-12-24 ENCOUNTER — Encounter: Payer: Self-pay | Admitting: Family Medicine

## 2017-12-24 ENCOUNTER — Ambulatory Visit (INDEPENDENT_AMBULATORY_CARE_PROVIDER_SITE_OTHER): Payer: Medicare Other | Admitting: Family Medicine

## 2017-12-24 VITALS — BP 100/60 | HR 79 | Temp 98.0°F | Ht 63.0 in | Wt 130.2 lb

## 2017-12-24 DIAGNOSIS — E785 Hyperlipidemia, unspecified: Secondary | ICD-10-CM | POA: Diagnosis not present

## 2017-12-24 DIAGNOSIS — Z0001 Encounter for general adult medical examination with abnormal findings: Secondary | ICD-10-CM | POA: Diagnosis not present

## 2017-12-24 DIAGNOSIS — Z86000 Personal history of in-situ neoplasm of breast: Secondary | ICD-10-CM | POA: Diagnosis not present

## 2017-12-24 DIAGNOSIS — F419 Anxiety disorder, unspecified: Secondary | ICD-10-CM | POA: Diagnosis not present

## 2017-12-24 DIAGNOSIS — Z23 Encounter for immunization: Secondary | ICD-10-CM | POA: Diagnosis not present

## 2017-12-24 LAB — LIPID PANEL
Cholesterol: 135 mg/dL (ref 0–200)
HDL: 54.8 mg/dL (ref >39.00)
LDL Cholesterol: 69 mg/dL (ref 0–99)
NONHDL: 80.59
Total CHOL/HDL Ratio: 2
Triglycerides: 56 mg/dL (ref 0.0–149.0)
VLDL: 11.2 mg/dL (ref 0.0–40.0)

## 2017-12-24 LAB — COMPREHENSIVE METABOLIC PANEL WITH GFR
ALT: 19 U/L (ref 0–35)
AST: 22 U/L (ref 0–37)
Albumin: 4.2 g/dL (ref 3.5–5.2)
Alkaline Phosphatase: 52 U/L (ref 39–117)
BUN: 18 mg/dL (ref 6–23)
CO2: 32 meq/L (ref 19–32)
Calcium: 9.4 mg/dL (ref 8.4–10.5)
Chloride: 101 meq/L (ref 96–112)
Creatinine, Ser: 0.74 mg/dL (ref 0.40–1.20)
GFR: 82.95 mL/min
Glucose, Bld: 90 mg/dL (ref 70–99)
Potassium: 4.5 meq/L (ref 3.5–5.1)
Sodium: 138 meq/L (ref 135–145)
Total Bilirubin: 0.6 mg/dL (ref 0.2–1.2)
Total Protein: 7.4 g/dL (ref 6.0–8.3)

## 2017-12-24 MED ORDER — ATORVASTATIN CALCIUM 20 MG PO TABS: 20.0000 mg | ORAL_TABLET | Freq: Every day | ORAL | 3 refills | Status: DC

## 2017-12-24 MED ORDER — CLORAZEPATE DIPOTASSIUM 7.5 MG PO TABS: ORAL_TABLET | ORAL | 2 refills | Status: DC

## 2017-12-24 NOTE — Assessment & Plan Note (Addendum)
Physical exam completed.  Patient does have a history of DCIS and is scheduled for a diagnostic left mammogram later this month.  She does have some soreness on exam.  No palpable masses.  We will add an ultrasound and try to get this scheduled for the patient.  She will follow-up with her oncology team as well.  She does have osteoporosis.  She reports a history of stomach ulcers in the past.  Fosamax is not a great option for her.  I have previously discussed injectable therapy though she is hesitant to try these given potential risk factors.  Discussed appropriate vitamin D supplementation.  She reports her cardiologist told her not to use calcium supplementation.  Encouraged dietary intake of calcium.  Encouraged activity.  Lab work as outlined below.  Tdap given.

## 2017-12-24 NOTE — Patient Instructions (Signed)
Nice to see you. We will check lab work today and contact you with the results. We will get you set up for ultrasound along with her mammogram. We will give you a tetanus vaccination today.

## 2017-12-24 NOTE — Progress Notes (Signed)
Tommi Rumps, MD Phone: [phone]  Kiara Donovan is a 67 y.o. female who presents today for physical exam.  Not doing much exercise. She is trying to do better with her diet.  More fruits and vegetables. Due for mammogram later this month at [facility].  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  Does note some residual soreness in her left breast. She is on anastrazole Due for tetanus vaccination. Reports she just had a Pap smear through gynecology. Colonoscopy is up-to-date. Hepatitis C up-to-date. Pneumonia vaccine up-to-date. Flu vaccine up-to-date. DEXA scan up-to-date.  She has been hesitant to go on treatment.  Does have a history of stomach ulcers. No alcohol use, tobacco use, or illicit drug use. Notes her anxiety is very well controlled.  Needs a refill on her clorazepate. No drowsiness with this.   Active Ambulatory Problems    Diagnosis Date Noted  . Anxiety 09/05/2011  . Osteoporosis 09/05/2011  . Hemorrhoid 09/07/2012  . Screening for breast cancer 09/07/2012  . Edema, lower extremity 09/12/2013  . Screening for colon cancer 09/12/2013  . Familial hyperlipidemia 10/22/2014  . Tongue irritation 11/30/2016  . 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 Syncope 03/31/2017  . Encounter for general adult medical examination with abnormal findings 12/24/2017   Resolved Ambulatory Problems    Diagnosis Date Noted  . General medical exam 09/07/2012  . Candidiasis, skin or nails 09/07/2012  . Routine physical examination 09/12/2013  . Tongue coating 09/24/2014  . Tongue ulceration 11/26/2014  . Medicare annual wellness visit, initial 09/26/2015  . Enlarged lymph nodes 09/21/2016   Past Medical History:  Diagnosis Date  . Cancer ([place]) 10/2016  . Rheumatic disease   . Vestibulitis, vulvar     Family History  Problem Relation Age of Onset  . Cancer Mother        cervical and melanoma  . Thyroid nodules Mother   . Cancer Father         prostate cancer  . Heart disease Father        s/p CABG  . Cancer Sister        melanoma  . Heart disease Maternal Grandmother   . Heart disease Paternal Grandmother   . Heart disease Paternal Grandfather   . Miscarriages / Stillbirths Paternal Grandfather     Social History   Socioeconomic History  . Marital status: Married    Spouse name: Not on file  . Number of children: Not on file  . Years of education: Not on file  . Highest education level: Not on file  Social Needs  . Financial resource strain: Not on file  . Food insecurity - worry: Not on file  . Food insecurity - inability: Not on file  . Transportation needs - medical: Not on file  . Transportation needs - non-medical: Not on file  Occupational History  . Not on file  Tobacco Use  . Smoking status: Never Smoker  . Smokeless tobacco: Never Used  Substance and Sexual Activity  . Alcohol use: No  . Drug use: No  . Sexual activity: Not on file  Other Topics Concern  . Not on file  Social History Narrative   Lives in [place]. Married with 1 child. No pets. 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      Diet healthy diet   Exercise- walks 3x per week       ROS  General:  Negative for nexplained weight loss, fever Skin: Negative for  new or changing mole, sore that won't heal HEENT: Negative for trouble hearing, trouble seeing, ringing in ears, mouth sores, hoarseness, change in voice, dysphagia. CV:  Negative for chest pain, dyspnea, edema, palpitations Resp: Negative for cough, dyspnea, hemoptysis GI: Negative for nausea, vomiting, diarrhea, constipation, abdominal pain, melena, hematochezia. GU: Negative for dysuria, incontinence, urinary hesitance, hematuria, vaginal or penile discharge, polyuria, sexual difficulty, lumps in testicle or breasts MSK: Negative for muscle cramps or aches, joint pain or swelling Neuro: Negative for headaches, weakness, numbness, dizziness, passing out/fainting Psych: Negative for  depression, anxiety, memory problems  Objective  Physical Exam Vitals:   12/24/17 1054  BP: 100/60  Pulse: 79  Temp: 98 F (36.7 C)  SpO2: 98%    BP Readings from Last 3 Encounters:  12/24/17 100/60  03/31/17 110/80  12/13/16 (!) 141/79   Wt Readings from Last 3 Encounters:  12/24/17 130 lb 3.2 oz (59.1 kg)  03/31/17 127 lb 6.4 oz (57.8 kg)  12/13/16 124 lb (56.2 kg)    Physical Exam  Constitutional: No distress.  HENT:  Head: Normocephalic and atraumatic.  Mouth/Throat: Oropharynx is clear and moist.  Eyes: Conjunctivae are normal. Pupils are equal, round, and reactive to light.  Cardiovascular: Normal rate, regular rhythm and normal heart sounds.  Pulmonary/Chest: Effort normal and breath sounds normal.  Abdominal: Soft. Bowel sounds are normal. She exhibits no distension. There is no tenderness. There is no rebound and no guarding.  Musculoskeletal: She exhibits no edema.  Neurological: She is alert. Gait normal.  Skin: Skin is warm and dry. She is not diaphoretic.  Psychiatric: Mood and affect normal.  Left breast with no apparent skin changes, there is slight soreness at the 12 o'clock position and the very lateral position at the 3 o'clock position, no masses felt in these areas, no nipple inversion, right breast with no masses, skin changes, or nipple inversion, no axillary masses bilaterally   Assessment/Plan:   Encounter for general adult medical examination with abnormal findings Physical exam completed.  Patient does have a history of DCIS and is scheduled for a diagnostic left mammogram later this month.  She does have some soreness on exam.  No palpable masses.  We will add an ultrasound and try to get this scheduled for the patient.  She will follow-up with her oncology team as well.  She does have osteoporosis.  She reports a history of stomach ulcers in the past.  Fosamax is not a great option for her.  I have previously discussed injectable therapy though  she is hesitant to try these given potential risk factors.  Discussed appropriate vitamin D supplementation.  She reports her cardiologist told her not to use calcium supplementation.  Encouraged dietary intake of calcium.  Encouraged activity.  Lab work as outlined below.  Tdap given.   Orders Placed This Encounter  Procedures  . US BREAST LTD UNI LEFT INC AXILLA    To be done through [facility], has diagnostic left breast mammogram scheduled 01/06/18    Standing Status:   Future    Standing Expiration Date:   02/22/2019    Order Specific Question:   Reason for Exam (SYMPTOM  OR DIAGNOSIS REQUIRED)    Answer:   history left breast cancer, tenderness at 12 oclock position and in most lateral aspect of breast tissue in to axilla    Order Specific Question:   Preferred imaging location?    Answer:   External  . Tdap vaccine greater than or equal to  7yo IM  . Lipid panel  . Comp Met (CMET)    Meds ordered this encounter  Medications  . clorazepate (TRANXENE) 7.5 MG tablet    Sig: 1.5 tablets by mouth daily prn    Dispense:  45 tablet    Refill:  2  . atorvastatin (LIPITOR) 20 MG tablet    Sig: Take 1 tablet (20 mg total) by mouth daily.    Dispense:  90 tablet    Refill:  [place], MD [place]

## 2018-02-24 ENCOUNTER — Telehealth: Payer: Self-pay

## 2018-02-24 NOTE — Telephone Encounter (Signed)
Copied from [place] [id number]. Topic: Quick Communication - Lab Results >> Feb 24, 2018  3:14 PM Scherrie Gerlach wrote: Pt would like a copy of her labs that were done in Jan mailed to her home.    Mailed.

## 2018-06-27 ENCOUNTER — Ambulatory Visit: Payer: Medicare Other | Admitting: Family Medicine

## 2018-08-03 ENCOUNTER — Telehealth: Payer: Self-pay | Admitting: Family Medicine

## 2018-08-03 NOTE — Telephone Encounter (Signed)
Declined 2019 AWV. Does not feel necessary. Please call next yr to see if she is interested then.

## 2018-11-26 ENCOUNTER — Telehealth: Payer: Self-pay | Admitting: Family Medicine

## 2018-11-26 NOTE — Telephone Encounter (Signed)
Opened in error

## 2019-01-12 ENCOUNTER — Other Ambulatory Visit: Payer: Self-pay | Admitting: Family Medicine

## 2019-02-21 DIAGNOSIS — I071 Rheumatic tricuspid insufficiency: Secondary | ICD-10-CM | POA: Insufficient documentation

## 2019-07-24 ENCOUNTER — Other Ambulatory Visit: Payer: Self-pay

## 2019-07-24 ENCOUNTER — Encounter: Payer: Self-pay | Admitting: Podiatry

## 2019-07-24 ENCOUNTER — Ambulatory Visit (INDEPENDENT_AMBULATORY_CARE_PROVIDER_SITE_OTHER): Payer: Medicare Other | Admitting: Podiatry

## 2019-07-24 VITALS — Temp 97.2°F

## 2019-07-24 DIAGNOSIS — L603 Nail dystrophy: Secondary | ICD-10-CM | POA: Diagnosis not present

## 2019-07-24 NOTE — Progress Notes (Signed)
She presents today after having not seen her for quite some time with a chief concern of a crack in her great toenail left.  She states that is discolored and thickened and she would like to see if it is fungus.  Objective: Vital signs are stable she alert oriented x3 pulses are strong palpable.  Hallux nail does demonstrate a area of thickening subungual debris and brittleness along the distal medial border of the hallux left.  Assessment: Nail dystrophy cannot rule out onychomycosis.  Plan: Sample of the nail and skin were taken today to be sent for pathologic evaluation we will follow-up with her in 1 month

## 2019-08-17 ENCOUNTER — Telehealth: Payer: Self-pay

## 2019-08-17 NOTE — Telephone Encounter (Signed)
Patient has been notified of results and she will cancel her upcoming appt and call back is she has any further problems.

## 2019-08-17 NOTE — Telephone Encounter (Signed)
-----   Message from Garrel Ridgel, [place] sent at 08/15/2019  7:15 AM EDT ----- NO FUNGUS. Positive for nail dystrophy most likely trauma.  Nothing can be done for this other than removal of nail.  Please cancel follow up appointment if this is all she is coming in for.

## 2019-08-21 ENCOUNTER — Ambulatory Visit: Payer: Medicare Other | Admitting: Podiatry

## 2019-12-11 ENCOUNTER — Emergency Department
Admission: EM | Admit: 2019-12-11 | Discharge: 2019-12-11 | Disposition: A | Discharge status: Home / Self Care | Payer: Medicare Other | Attending: Emergency Medicine | Admitting: Emergency Medicine

## 2019-12-11 ENCOUNTER — Telehealth: Payer: Self-pay | Admitting: Family Medicine

## 2019-12-11 ENCOUNTER — Other Ambulatory Visit: Payer: Self-pay

## 2019-12-11 ENCOUNTER — Emergency Department: Payer: Medicare Other

## 2019-12-11 DIAGNOSIS — K5792 Diverticulitis of intestine, part unspecified, without perforation or abscess without bleeding: Secondary | ICD-10-CM

## 2019-12-11 DIAGNOSIS — Z7982 Long term (current) use of aspirin: Secondary | ICD-10-CM | POA: Diagnosis not present

## 2019-12-11 DIAGNOSIS — R1030 Lower abdominal pain, unspecified: Secondary | ICD-10-CM | POA: Diagnosis present

## 2019-12-11 DIAGNOSIS — Z79899 Other long term (current) drug therapy: Secondary | ICD-10-CM | POA: Insufficient documentation

## 2019-12-11 DIAGNOSIS — R197 Diarrhea, unspecified: Secondary | ICD-10-CM | POA: Diagnosis not present

## 2019-12-11 DIAGNOSIS — Z853 Personal history of malignant neoplasm of breast: Secondary | ICD-10-CM | POA: Diagnosis not present

## 2019-12-11 LAB — URINALYSIS, COMPLETE (UACMP) WITH MICROSCOPIC
Bacteria, UA: NONE SEEN
Bilirubin Urine: NEGATIVE
Glucose, UA: 50 mg/dL — AB
Hgb urine dipstick: NEGATIVE
Ketones, ur: NEGATIVE mg/dL
Leukocytes,Ua: NEGATIVE
Nitrite: NEGATIVE
Protein, ur: NEGATIVE mg/dL
Specific Gravity, Urine: 1.017 (ref 1.005–1.030)
Squamous Epithelial / LPF: NONE SEEN (ref 0–5)
pH: 5 (ref 5.0–8.0)

## 2019-12-11 LAB — COMPREHENSIVE METABOLIC PANEL
ALT: 19 U/L (ref 0–44)
AST: 22 U/L (ref 15–41)
Albumin: 4.2 g/dL (ref 3.5–5.0)
Alkaline Phosphatase: 52 U/L (ref 38–126)
Anion gap: 13 (ref 5–15)
BUN: 13 mg/dL (ref 8–23)
CO2: 24 mmol/L (ref 22–32)
Calcium: 9.4 mg/dL (ref 8.9–10.3)
Chloride: 101 mmol/L (ref 98–111)
Creatinine, Ser: 0.62 mg/dL (ref 0.44–1.00)
GFR calc Af Amer: >60 mL/min (ref >60)
GFR calc non Af Amer: >60 mL/min (ref >60)
Glucose, Bld: 114 mg/dL — ABNORMAL HIGH (ref 70–99)
Potassium: 4 mmol/L (ref 3.5–5.1)
Sodium: 138 mmol/L (ref 135–145)
Total Bilirubin: 0.8 mg/dL (ref 0.3–1.2)
Total Protein: 7.9 g/dL (ref 6.5–8.1)

## 2019-12-11 LAB — CBC
HCT: 38 % (ref 36.0–46.0)
Hemoglobin: 13 g/dL (ref 12.0–15.0)
MCH: 30.5 pg (ref 26.0–34.0)
MCHC: 34.2 g/dL (ref 30.0–36.0)
MCV: 89.2 fL (ref 80.0–100.0)
Platelets: 214 10*3/uL (ref 150–400)
RBC: 4.26 MIL/uL (ref 3.87–5.11)
RDW: 12.7 % (ref 11.5–15.5)
WBC: 11.4 10*3/uL — ABNORMAL HIGH (ref 4.0–10.5)
nRBC: 0 % (ref 0.0–0.2)

## 2019-12-11 LAB — LIPASE, BLOOD: Lipase: 22 U/L (ref 11–51)

## 2019-12-11 MED ORDER — AMOXICILLIN-POT CLAVULANATE 875-125 MG PO TABS: 1.0000 | ORAL_TABLET | Freq: Two times a day (BID) | ORAL | 0 refills | Status: AC

## 2019-12-11 MED ORDER — IOHEXOL 9 MG/ML PO SOLN
500.0000 mL | Freq: Once | ORAL | Status: AC | PRN
  Administered 2019-12-11: 1000 mL via ORAL
  Filled 2019-12-11: qty 500

## 2019-12-11 MED ORDER — IOHEXOL 300 MG/ML  SOLN
100.0000 mL | Freq: Once | INTRAMUSCULAR | Status: AC | PRN
  Administered 2019-12-11: 100 mL via INTRAVENOUS
  Filled 2019-12-11: qty 100

## 2019-12-11 MED ORDER — MORPHINE SULFATE (PF) 4 MG/ML IV SOLN
4.0000 mg | Freq: Once | INTRAVENOUS | Status: DC
  Filled 2019-12-11: qty 1

## 2019-12-11 MED ORDER — ONDANSETRON HCL 4 MG/2ML IJ SOLN
4.0000 mg | Freq: Once | INTRAMUSCULAR | Status: DC
  Filled 2019-12-11: qty 2

## 2019-12-11 NOTE — Telephone Encounter (Signed)
Patient is having stomach problems and the pain is keeping her up at night. When having a bowel movement there is some red blood in stool. No appointments available.

## 2019-12-11 NOTE — Telephone Encounter (Signed)
I called the patient to schedule a 4:15 virtual visit today per dr. Caryl Bis and lmtcb on voicemail.  Pryce Folts,cma

## 2019-12-11 NOTE — ED Notes (Signed)
See triage note  Presents with some abd pain  States she had a hx of an ulcer in past  Sx's feel the same    States pain started intermittently several weeks ago.  She also noticed some blood

## 2019-12-11 NOTE — Telephone Encounter (Signed)
Patient called and stated she is having stomach pain mostly at night and she has a bowel movement with a little blood in it. Patient stated she had a colonoscopy 2019 and they di say she had hemorrhoids so she thinks that's where the blood is coming from. She stated in 2015 she saw Dr. Vira Agar and he is the one discovered her ulcer and he prescribed Sucralfate 1 gram  and nexium and it helped her. You do not have any openings and the patient wants to know if you could write her a new Rx for this medication and nexium for some relief.  Avish Torry,cma

## 2019-12-11 NOTE — Discharge Instructions (Addendum)
Please follow-up with your GI doctor within the next several weeks for recheck/reevaluation.

## 2019-12-11 NOTE — Telephone Encounter (Signed)
Please see if she can do a virtual visit at 415 today.  Thanks.

## 2019-12-11 NOTE — ED Provider Notes (Signed)
[facility] Emergency Department Provider Note  Time seen: 2:16 PM  I have reviewed the triage vital signs and the nursing notes.   HISTORY  Chief Complaint Abdominal Pain   HPI Kiara Donovan is a 68 y.o. female with a past medical history of hemorrhoids, presents to the emergency department for lower abdominal discomfort.  According to the patient for the past 2 to 3 weeks she has been experiencing intermittent lower abdominal pain.  States she has been experiencing frequent episodes of diarrhea as well usually 3 times per day.  Patient has hemorrhoids and will occasionally see bright red blood on her toilet paper when wiping.  Patient denies any nausea or vomiting.  Denies any fever.  No history of diverticulitis.  Had a colonoscopy 1 year ago showing diverticulosis but no other findings per patient.   Past Medical History:  Diagnosis Date  . Cancer ([place]) 10/2016   left breast lumpectomy  . Rheumatic disease   . Vestibulitis, vulvar    seen at [facility]    Patient Active Problem List   Diagnosis Date Noted  . Moderate tricuspid insufficiency 02/21/2019  . Encounter for general adult medical examination with abnormal findings 12/24/2017  . Bilateral carotid artery stenosis 05/10/2017  . Syncope 03/31/2017  . Closed fracture of nasal bones 01/13/2017  . Deviated nasal septum 01/13/2017  . Facial trauma, sequela 01/13/2017  . History of nasal surgery 01/13/2017  . Hypertrophy of both inferior nasal turbinates 01/13/2017  . Other chronic sinusitis 01/13/2017  . Tongue irritation 11/30/2016  . 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 Familial hyperlipidemia 10/22/2014  . Edema, lower extremity 09/12/2013  . Screening for colon cancer 09/12/2013  . Hemorrhoid 09/07/2012  . Screening for breast cancer 09/07/2012  . Anxiety 09/05/2011  . Osteoporosis 09/05/2011    Past Surgical History:  Procedure Laterality Date  . COLONOSCOPY  2004    Dr. Kalman Drape, [place] [place], normal  . TONSILLECTOMY    . VAGINAL DELIVERY      Prior to Admission medications   Medication Sig Start Date End Date Taking? Authorizing Provider  anastrozole (ARIMIDEX) 1 MG tablet Take 1 mg by mouth daily. 11/23/17   [provider]  aspirin EC 81 MG tablet Take 81 mg by mouth daily.    [provider]  atorvastatin (LIPITOR) 20 MG tablet TAKE 1 TABLET BY MOUTH DAILY 01/13/19   Leone Haven, MD  Cholecalciferol (D3 ADULT PO) Take by mouth.    [provider]  clorazepate (TRANXENE) 7.5 MG tablet 1.5 tablets by mouth daily prn 12/24/17   Leone Haven, MD  Multiple Vitamin (MULTIVITAMIN) capsule Take 1 capsule by mouth daily.      [provider]  Probiotic Product ([place]) Take by mouth.    [provider]    Allergies  Allergen Reactions  . Neomycin-Bacitracin Zn-Polymyx Other (See Comments)    Itchy; red eyes to drops   . Tape Other (See Comments)    Other reaction(s): Other (See Comments) Skin irritation. Prefers paper tape. Skin irritation. Prefers paper tape.     Family History  Problem Relation Age of Onset  . Cancer Mother        cervical and melanoma  . Thyroid nodules Mother   . Cancer Father        prostate cancer  . Heart disease Father        s/p CABG  . Cancer Sister  melanoma  . Heart disease Maternal Grandmother   . Heart disease Paternal Grandmother   . Heart disease Paternal Grandfather   . Miscarriages / Stillbirths Paternal Grandfather     Social History Social History   Tobacco Use  . Smoking status: Never Smoker  . Smokeless tobacco: Never Used  Substance Use Topics  . Alcohol use: No  . Drug use: No    Review of Systems Constitutional: Negative for fever. Cardiovascular: Negative for chest pain. Respiratory: Negative for shortness of breath. Gastrointestinal: Lower abdominal discomfort.  Mild dull aching pain. Genitourinary:  Negative for urinary compaints Musculoskeletal: Negative for musculoskeletal complaints Neurological: Negative for headache All other ROS negative  ____________________________________________   PHYSICAL EXAM:  VITAL SIGNS: ED Triage Vitals  Enc Vitals Group     BP 12/11/19 1312 (!) 136/94     Pulse Rate 12/11/19 1312 (!) 106     Resp 12/11/19 1312 18     Temp 12/11/19 1312 99.1 F (37.3 C)     Temp Source 12/11/19 1312 Oral     SpO2 12/11/19 1312 98 %     Weight 12/11/19 1313 129 lb (58.5 kg)     Height 12/11/19 1313 5\' 3"  (1.6 m)     Head Circumference --      Peak Flow --      Pain Score 12/11/19 1313 2     Pain Loc --      Pain Edu? --      Excl. in [place]? --    Constitutional: Alert and oriented. Well appearing and in no distress. Eyes: Normal exam ENT      Head: Normocephalic and atraumatic.      Mouth/Throat: Mucous membranes are moist. Cardiovascular: Normal rate, regular rhythm.  Respiratory: Normal respiratory effort without tachypnea nor retractions. Breath sounds are clear  Gastrointestinal: Soft, mild diffuse tenderness more so in the lower abdomen.  Fairly equal between right and left lower quadrants. Musculoskeletal: Nontender with normal range of motion in all extremities.  Neurologic:  Normal speech and language. No gross focal neurologic deficits Skin:  Skin is warm, dry and intact.  Psychiatric: Mood and affect are normal.   ____________________________________________   RADIOLOGY  IMPRESSION:  1. Interval diffuse medium and low density wall thickening involving  the mid and distal sigmoid colon with mild diffuse medium density  wall thickening involving an adjacent loop of small bowel. This is  most likely due to sigmoid diverticulitis with secondary effects on  an adjacent loop of small bowel. There are associated areas of low  density in the walls of the sigmoid colon without the typical  appearance of intramural abscess and most likely  represent edema  associated with diverticulitis. A neoplastic process is less likely.  2. Diffuse low density wall thickening involving the gastric  pylorus, similar to the previous examination. This could represent  chronic inflammation. The stability makes a neoplastic process less  likely.  3. Colonic diverticulosis.   ____________________________________________   INITIAL IMPRESSION / ASSESSMENT AND PLAN / ED COURSE  Pertinent labs & imaging results that were available during my care of the patient were reviewed by me and considered in my medical decision making (see chart for details).   Patient presents emergency department for several weeks of lower abdominal discomfort.  Patient thought the discomfort could be due to gastric reflux although denies any specific reflux symptoms, also thought it could be due to to her stomach ulcer she has had in the past although states  the discomfort is mostly in the lower abdomen.  Denies any black stool.  Patient has been taking omeprazole over-the-counter over the past week or 2 with no improvement.  States symptoms are worse at night when she lies down but again denies any reflux into the chest.  Given the patient's symptoms and mild tenderness we will obtain a CT scan of the abdomen/pelvis to further evaluate.  Differential would include colitis, diverticulitis, or other intra-abdominal pathology.  No urinary symptoms, normal urinalysis.  Blood work is overall reassuring besides a slight leukocytosis.  CT scan looks most consistent with sigmoid diverticulitis.  Patient had a colonoscopy last year making neoplastic process much less likely.  We will place the patient on Augmentin, have the patient follow-up with her GI physician.  Patient agreeable to plan of care.  MANEET MCDUFFEY was evaluated in Emergency Department on 12/11/2019 for the symptoms described in the history of present illness. She was evaluated in the context of the global COVID-19  pandemic, which necessitated consideration that the patient might be at risk for infection with the SARS-CoV-2 virus that causes COVID-19. Institutional protocols and algorithms that pertain to the evaluation of patients at risk for COVID-19 are in a state of rapid change based on information released by regulatory bodies including the CDC and federal and state organizations. These policies and algorithms were followed during the patient's care in the ED.  ____________________________________________   FINAL CLINICAL IMPRESSION(S) / ED DIAGNOSES  Abdominal pain Diverticulitis   Harvest Dark, MD 12/11/19 1707

## 2019-12-11 NOTE — ED Triage Notes (Signed)
Reports abdominal pain intermittent for a few weeks, with intermittent mild bright red blood. Denies NVD. Pt alert and oriented X4, cooperative, RR even and unlabored, color WNL. Pt in NAD. Pain to left lower abdomen.

## 2019-12-12 NOTE — Telephone Encounter (Signed)
Paitient was seen at an urgent care.  Omaya Nieland,cma

## 2019-12-15 LAB — GI PATHOGEN PANEL BY PCR, STOOL

## 2020-01-15 ENCOUNTER — Other Ambulatory Visit: Payer: Self-pay | Admitting: Family Medicine

## 2020-07-02 ENCOUNTER — Ambulatory Visit: Payer: Medicare Other | Admitting: Dermatology

## 2020-08-06 ENCOUNTER — Other Ambulatory Visit: Payer: Self-pay

## 2020-08-06 ENCOUNTER — Ambulatory Visit (INDEPENDENT_AMBULATORY_CARE_PROVIDER_SITE_OTHER): Payer: Self-pay | Admitting: Dermatology

## 2020-08-06 DIAGNOSIS — L988 Other specified disorders of the skin and subcutaneous tissue: Secondary | ICD-10-CM

## 2020-08-06 DIAGNOSIS — D485 Neoplasm of uncertain behavior of skin: Secondary | ICD-10-CM

## 2020-08-06 NOTE — Progress Notes (Signed)
   Follow-Up Visit   Subjective  Kiara Donovan is a 69 y.o. female who presents for the following: Facial Elastosis (Botox today) and Other (Spot on right knee that she just noticed.).  The following portions of the chart were reviewed this encounter and updated as appropriate:  Tobacco  Allergies  Meds  Problems  Med Hx  Surg Hx  Fam Hx     Review of Systems:  No other skin or systemic complaints except as noted in HPI or Assessment and Plan.  Objective  Well appearing patient in no apparent distress; mood and affect are within normal limits.  A focused examination was performed including face, right knee, back. Relevant physical exam findings are noted in the Assessment and Plan.  Objective  Head - Anterior (Face): Rhytides and volume loss.   Images                  Objective  Right Knee - Anterior: >4mm brown macule      Assessment & Plan    Seborrheic Keratoses - Stuck-on, waxy, tan-brown papules and plaques  - Discussed benign etiology and prognosis. - Observe - Call for any changes  Actinic Damage - diffuse scaly erythematous macules with underlying dyspigmentation - Recommend daily broad spectrum sunscreen SPF 30+ to sun-exposed areas, reapply every 2 hours as needed.  - Call for new or changing lesions.   Elastosis of skin Head - Anterior (Face)  Botox  20 units frown complex            10 units each bilateral crow's feet             5 units each bilateral brow lift  50 units total   Botox Injection - Head - Anterior (Face) Location: See attached image  Informed consent: Discussed risks (infection, pain, bleeding, bruising, swelling, allergic reaction, paralysis of nearby muscles, eyelid droop, double vision, neck weakness, difficulty breathing, headache, undesirable cosmetic result, and need for additional treatment) and benefits of the procedure, as well as the alternatives.  Informed consent was obtained.  Preparation:  The area was cleansed with alcohol.  Procedure Details:  Botox was injected into the dermis with a 30-gauge needle. Pressure applied to any bleeding. Ice packs offered for swelling.  Lot Number:  [id number] Expiration:  08/2022  Total Units Injected:  50  Plan: Patient was instructed to remain upright for 4 hours. Patient was instructed to avoid massaging the face and avoid vigorous exercise for the rest of the day. Tylenol may be used for headache.  Allow 2 weeks before returning to clinic for additional dosing as needed. Patient will call for any problems.   Neoplasm of uncertain behavior of skin Right Knee - Anterior  Nevus vs [place] vs other - Recheck on follow up. RTC with any changes.  Return for Botox in 3-4 months.   I, Ashok Cordia, CMA, am acting as scribe for Sarina Ser, MD .  Documentation: I have reviewed the above documentation for accuracy and completeness, and I agree with the above.  Sarina Ser, MD

## 2020-08-07 ENCOUNTER — Encounter: Payer: Self-pay | Admitting: Dermatology

## 2020-11-26 ENCOUNTER — Other Ambulatory Visit: Payer: Self-pay

## 2020-11-26 ENCOUNTER — Ambulatory Visit (INDEPENDENT_AMBULATORY_CARE_PROVIDER_SITE_OTHER): Payer: Medicare PPO | Admitting: Dermatology

## 2020-11-26 DIAGNOSIS — L578 Other skin changes due to chronic exposure to nonionizing radiation: Secondary | ICD-10-CM | POA: Diagnosis not present

## 2020-11-26 DIAGNOSIS — D2271 Melanocytic nevi of right lower limb, including hip: Secondary | ICD-10-CM | POA: Diagnosis not present

## 2020-11-26 DIAGNOSIS — L988 Other specified disorders of the skin and subcutaneous tissue: Secondary | ICD-10-CM | POA: Diagnosis not present

## 2020-11-26 DIAGNOSIS — D229 Melanocytic nevi, unspecified: Secondary | ICD-10-CM

## 2020-11-26 DIAGNOSIS — Z872 Personal history of diseases of the skin and subcutaneous tissue: Secondary | ICD-10-CM

## 2020-11-26 DIAGNOSIS — L820 Inflamed seborrheic keratosis: Secondary | ICD-10-CM

## 2020-11-26 NOTE — Progress Notes (Signed)
   Follow-Up Visit   Subjective  Kiara Donovan is a 69 y.o. 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  The following portions of the chart were reviewed this encounter and updated as appropriate:   Tobacco  Allergies  Meds  Problems  Med Hx  Surg Hx  Fam Hx     Review of Systems:  No other skin or systemic complaints except as noted in HPI or Assessment and Plan.  Objective  Well appearing patient in no apparent distress; mood and affect are within normal limits.  A focused examination was performed including face, back, right leg. Relevant physical exam findings are noted in the Assessment and Plan.  Objective  Face: Rhytides and volume loss.   Images    Objective  Right Knee - Anterior: 0.2 cm brown macule  Objective  Left Lower Back: Erythematous keratotic or waxy stuck-on papule or plaque.    Assessment & Plan    Actinic Damage - chronic, secondary to cumulative UV radiation exposure/sun exposure over time - diffuse scaly erythematous macules with underlying dyspigmentation - Recommend daily broad spectrum sunscreen SPF 30+ to sun-exposed areas, reapply every 2 hours as needed.  - Call for new or changing lesions.  Elastosis of skin Face  Botox today Frown Complex 20 units Brow lift 5 units each side Crows feet 10 units each side  Botox Injection - Face Location: See attached image  Informed consent: Discussed risks (infection, pain, bleeding, bruising, swelling, allergic reaction, paralysis of nearby muscles, eyelid droop, double vision, neck weakness, difficulty breathing, headache, undesirable cosmetic result, and need for additional treatment) and benefits of the procedure, as well as the alternatives.  Informed consent was obtained.  Preparation: The area was cleansed with alcohol.  Procedure Details:  Botox was injected into the dermis with a 30-gauge  needle. Pressure applied to any bleeding. Ice packs offered for swelling.  Lot Number:  [id number] Expiration:  02/2023  Total Units Injected:  50  Plan: Patient was instructed to remain upright for 4 hours. Patient was instructed to avoid massaging the face and avoid vigorous exercise for the rest of the day. Tylenol may be used for headache.  Allow 2 weeks before returning to clinic for additional dosing as needed. Patient will call for any problems.   Nevus Right Knee - Anterior  Benign, observe.    Inflamed seborrheic keratosis Left Lower Back  Destruction of lesion - Left Lower Back Complexity: simple   Destruction method: cryotherapy   Informed consent: discussed and consent obtained   Timeout:  patient name, date of birth, surgical site, and procedure verified Lesion destroyed using liquid nitrogen: Yes   Region frozen until ice ball extended beyond lesion: Yes   Outcome: patient tolerated procedure well with no complications   Post-procedure details: wound care instructions given    History of actinic keratoses Face  Clear. Observe for recurrence. Call clinic for new or changing lesions.  Recommend regular skin exams, daily broad-spectrum spf 30+ sunscreen use, and photoprotection.     Return for Botox in 3-4 months.  I, Ashok Cordia, CMA, am acting as scribe for Sarina Ser, MD .  Documentation: I have reviewed the above documentation for accuracy and completeness, and I agree with the above.  Sarina Ser, MD

## 2020-12-02 ENCOUNTER — Encounter: Payer: Self-pay | Admitting: Dermatology

## 2021-02-24 DIAGNOSIS — I208 Other forms of angina pectoris: Secondary | ICD-10-CM | POA: Insufficient documentation

## 2021-02-24 DIAGNOSIS — I2089 Other forms of angina pectoris: Secondary | ICD-10-CM | POA: Insufficient documentation

## 2021-04-08 ENCOUNTER — Ambulatory Visit (INDEPENDENT_AMBULATORY_CARE_PROVIDER_SITE_OTHER): Payer: Medicare PPO | Admitting: Dermatology

## 2021-04-08 ENCOUNTER — Other Ambulatory Visit: Payer: Self-pay

## 2021-04-08 DIAGNOSIS — L988 Other specified disorders of the skin and subcutaneous tissue: Secondary | ICD-10-CM

## 2021-04-08 DIAGNOSIS — L820 Inflamed seborrheic keratosis: Secondary | ICD-10-CM | POA: Diagnosis not present

## 2021-04-08 DIAGNOSIS — L578 Other skin changes due to chronic exposure to nonionizing radiation: Secondary | ICD-10-CM

## 2021-04-08 DIAGNOSIS — B079 Viral wart, unspecified: Secondary | ICD-10-CM

## 2021-04-08 NOTE — Progress Notes (Signed)
Follow-Up Visit   Subjective  Kiara Donovan is a 70 y.o. female who presents for the following: Facial Elastosis (Face, patient presents for botox), Lesion  (In scalp - patient noticed it a linear ridge in her scalp and would like it checked today ), and Lesion (On the right thumb - patient thinks it may be a wart and has treated it several times with Compound W, but it keeps growing back).  The following portions of the chart were reviewed this encounter and updated as appropriate:   Tobacco  Allergies  Meds  Problems  Med Hx  Surg Hx  Fam Hx     Review of Systems:  No other skin or systemic complaints except as noted in HPI or Assessment and Plan.  Objective  Well appearing patient in no apparent distress; mood and affect are within normal limits.  A focused examination was performed including the face, scalp, and right hand. Relevant physical exam findings are noted in the Assessment and Plan.  Objective  Head - Anterior (Face): Rhytides and volume loss.   Images    Objective  Miline vertex scalp x 1: Erythematous keratotic or waxy stuck-on papule or plaque.   Objective  R dorsum thumb x 1: Verrucous papules -- Discussed viral etiology and contagion.    Assessment & Plan  Elastosis of skin Head - Anterior (Face) Botox  units injected today to: - Frown complex 20 units - Brow lift 5 units each side total of 10 units - Crow's feet 10 units each side total of 20 units  Botox Injection - Head - Anterior (Face) Location: Frown complex, brow lift, crow's feet  Informed consent: Discussed risks (infection, pain, bleeding, bruising, swelling, allergic reaction, paralysis of nearby muscles, eyelid droop, double vision, neck weakness, difficulty breathing, headache, undesirable cosmetic result, and need for additional treatment) and benefits of the procedure, as well as the alternatives.  Informed consent was obtained.  Preparation: The area was cleansed with  alcohol.  Procedure Details:  Botox was injected into the dermis with a 30-gauge needle. Pressure applied to any bleeding. Ice packs offered for swelling.  Lot Number:  [id number] Expiration:  05/2023  Total Units Injected:  50  Plan: Patient was instructed to remain upright for 4 hours. Patient was instructed to avoid massaging the face and avoid vigorous exercise for the rest of the day. Tylenol may be used for headache.  Allow 2 weeks before returning to clinic for additional dosing as needed. Patient will call for any problems.  Inflamed seborrheic keratosis Midline vertex scalp x 1 Destruction of lesion - Miline vertex scalp x 1 Complexity: simple   Destruction method: cryotherapy   Informed consent: discussed and consent obtained   Timeout:  patient name, date of birth, surgical site, and procedure verified Lesion destroyed using liquid nitrogen: Yes   Region frozen until ice ball extended beyond lesion: Yes   Outcome: patient tolerated procedure well with no complications   Post-procedure details: wound care instructions given    Viral warts, unspecified type R dorsum thumb x 1 Destruction of lesion - R dorsum thumb x 1 Complexity: simple   Destruction method: cryotherapy   Informed consent: discussed and consent obtained   Timeout:  patient name, date of birth, surgical site, and procedure verified Lesion destroyed using liquid nitrogen: Yes   Region frozen until ice ball extended beyond lesion: Yes   Outcome: patient tolerated procedure well with no complications   Post-procedure details: wound care instructions given  Actinic Damage - chronic, secondary to cumulative UV radiation exposure/sun exposure over time - diffuse scaly erythematous macules with underlying dyspigmentation - Recommend daily broad spectrum sunscreen SPF 30+ to sun-exposed areas, reapply every 2 hours as needed.  - Recommend staying in the shade or wearing long sleeves, sun glasses (UVA+UVB  protection) and wide brim hats (4-inch brim around the entire circumference of the hat). - Call for new or changing lesions.  Return for 3-76m Botox, recheck ISK midline vertex scalp.  I, Othelia Pulling, RMA, am acting as scribe for Sarina Ser, MD .  Documentation: I have reviewed the above documentation for accuracy and completeness, and I agree with the above.  Sarina Ser, MD

## 2021-04-08 NOTE — Patient Instructions (Addendum)
If you have any questions or concerns for your doctor, please call our main line at [phone] and press option 4 to reach your doctor's medical assistant. If no one answers, please leave a voicemail as directed and we will return your call as soon as possible. Messages left after 4 pm will be answered the following business day.   You may also send [place] a message via [place]. We typically respond to MyChart messages within 1-2 business days.  For prescription refills, please ask your pharmacy to contact our office. Our fax number is [phone].  If you have an urgent issue when the clinic is closed that cannot wait until the next business day, you can page your doctor at the number below.    Please note that while we do our best to be available for urgent issues outside of office hours, we are not available 24/7.   If you have an urgent issue and are unable to reach [place], you may choose to seek medical care at your doctor's office, retail clinic, urgent care center, or emergency room.  If you have a medical emergency, please immediately call 911 or go to the emergency department.  Pager Numbers  - Dr. Nehemiah Massed: [phone]  - Dr. Laurence Ferrari: [phone]  - Dr. Nicole Kindred: [phone]  In the event of inclement weather, please call our main line at [phone] for an update on the status of any delays or closures.  Dermatology Medication Tips: Please keep the boxes that topical medications come in in order to help keep track of the instructions about where and how to use these. Pharmacies typically print the medication instructions only on the boxes and not directly on the medication tubes.   If your medication is too expensive, please contact our office at [phone] option 4 or send [place] a message through [place].   We are unable to tell what your co-pay for medications will be in advance as this is different depending on your insurance coverage. However, we may be able to find a substitute  medication at lower cost or fill out paperwork to get insurance to cover a needed medication.   If a prior authorization is required to get your medication covered by your insurance company, please allow [place] 1-2 business days to complete this process.  Drug prices often vary depending on where the prescription is filled and some pharmacies may offer cheaper prices.  The website www.goodrx.com contains coupons for medications through different pharmacies. The prices here do not account for what the cost may be with help from insurance (it may be cheaper with your insurance), but the website can give you the price if you did not use any insurance.  - You can print the associated coupon and take it with your prescription to the pharmacy.  - You may also stop by our office during regular business hours and pick up a GoodRx coupon card.  - If you need your prescription sent electronically to a different pharmacy, notify our office through [facility] or by phone at [phone] option 4.   Seborrheic Keratosis  What causes seborrheic keratoses? Seborrheic keratoses are harmless, common skin growths that first appear during adult life.  As time goes by, more growths appear.  Some people may develop a large number of them.  Seborrheic keratoses appear on both covered and uncovered body parts.  They are not caused by sunlight.  The tendency to develop seborrheic keratoses can be inherited.  They vary in color from skin-colored to  gray, brown, or even black.  They can be either smooth or have a rough, warty surface.   Seborrheic keratoses are superficial and look as if they were stuck on the skin.  Under the microscope this type of keratosis looks like layers upon layers of skin.  That is why at times the top layer may seem to fall off, but the rest of the growth remains and re-grows.    Treatment Seborrheic keratoses do not need to be treated, but can easily be removed in the office.  Seborrheic  keratoses often cause symptoms when they rub on clothing or jewelry.  Lesions can be in the way of shaving.  If they become inflamed, they can cause itching, soreness, or burning.  Removal of a seborrheic keratosis can be accomplished by freezing, burning, or surgery. If any spot bleeds, scabs, or grows rapidly, please return to have it checked, as these can be an indication of a skin cancer.

## 2021-04-09 ENCOUNTER — Encounter: Payer: Self-pay | Admitting: Dermatology

## 2021-08-19 ENCOUNTER — Ambulatory Visit (INDEPENDENT_AMBULATORY_CARE_PROVIDER_SITE_OTHER): Payer: Medicare PPO | Admitting: Dermatology

## 2021-08-19 ENCOUNTER — Other Ambulatory Visit: Payer: Self-pay

## 2021-08-19 ENCOUNTER — Encounter: Payer: Self-pay | Admitting: Dermatology

## 2021-08-19 DIAGNOSIS — L988 Other specified disorders of the skin and subcutaneous tissue: Secondary | ICD-10-CM | POA: Diagnosis not present

## 2021-08-19 DIAGNOSIS — D485 Neoplasm of uncertain behavior of skin: Secondary | ICD-10-CM

## 2021-08-19 DIAGNOSIS — D2271 Melanocytic nevi of right lower limb, including hip: Secondary | ICD-10-CM

## 2021-08-19 DIAGNOSIS — D229 Melanocytic nevi, unspecified: Secondary | ICD-10-CM

## 2021-08-19 DIAGNOSIS — D492 Neoplasm of unspecified behavior of bone, soft tissue, and skin: Secondary | ICD-10-CM

## 2021-08-19 NOTE — Patient Instructions (Signed)
Wound Care Instructions  Cleanse wound gently with soap and water once a day then pat dry with clean gauze. Apply a thing coat of Petrolatum (petroleum jelly, "Vaseline") over the wound (unless you have an allergy to this). We recommend that you use a new, sterile tube of Vaseline. Do not pick or remove scabs. Do not remove the yellow or white "healing tissue" from the base of the wound.  Cover the wound with fresh, clean, nonstick gauze and secure with paper tape. You may use Band-Aids in place of gauze and tape if the would is small enough, but would recommend trimming much of the tape off as there is often too much. Sometimes Band-Aids can irritate the skin.  You should call the office for your biopsy report after 1 week if you have not already been contacted.  If you experience any problems, such as abnormal amounts of bleeding, swelling, significant bruising, significant pain, or evidence of infection, please call the office immediately.  FOR ADULT SURGERY PATIENTS: If you need something for pain relief you may take 1 extra strength Tylenol (acetaminophen) AND 2 Ibuprofen (200mg each) together every 4 hours as needed for pain. (do not take these if you are allergic to them or if you have a reason you should not take them.) Typically, you may only need pain medication for 1 to 3 days.   If you have any questions or concerns for your doctor, please call our main line at [phone] and press option 4 to reach your doctor's medical assistant. If no one answers, please leave a voicemail as directed and we will return your call as soon as possible. Messages left after 4 pm will be answered the following business day.   You may also send us a message via MyChart. We typically respond to MyChart messages within 1-2 business days.  For prescription refills, please ask your pharmacy to contact our office. Our fax number is [phone].  If you have an urgent issue when the clinic is closed that  cannot wait until the next business day, you can page your doctor at the number below.    Please note that while we do our best to be available for urgent issues outside of office hours, we are not available 24/7.   If you have an urgent issue and are unable to reach us, you may choose to seek medical care at your doctor's office, retail clinic, urgent care center, or emergency room.  If you have a medical emergency, please immediately call 911 or go to the emergency department.  Pager Numbers  - Dr. Kowalski: [phone]  - Dr. Moye: [phone]  - Dr. Stewart: [phone]  In the event of inclement weather, please call our main line at [phone] for an update on the status of any delays or closures.  Dermatology Medication Tips: Please keep the boxes that topical medications come in in order to help keep track of the instructions about where and how to use these. Pharmacies typically print the medication instructions only on the boxes and not directly on the medication tubes.   If your medication is too expensive, please contact our office at [phone] option 4 or send us a message through MyChart.   We are unable to tell what your co-pay for medications will be in advance as this is different depending on your insurance coverage. However, we may be able to find a substitute medication at lower cost or fill out paperwork to get insurance to cover a needed   medication.   If a prior authorization is required to get your medication covered by your insurance company, please allow us 1-2 business days to complete this process.  Drug prices often vary depending on where the prescription is filled and some pharmacies may offer cheaper prices.  The website www.goodrx.com contains coupons for medications through different pharmacies. The prices here do not account for what the cost may be with help from insurance (it may be cheaper with your insurance), but the website can give you the  price if you did not use any insurance.  - You can print the associated coupon and take it with your prescription to the pharmacy.  - You may also stop by our office during regular business hours and pick up a GoodRx coupon card.  - If you need your prescription sent electronically to a different pharmacy, notify our office through [facility] MyChart or by phone at [phone] option 4.   

## 2021-08-19 NOTE — Progress Notes (Signed)
Follow-Up Visit   Subjective  Kiara Donovan is a 70 y.o. female who presents for the following: Facial Elastosis (Patient is here today for Botox injections. She also has a dark lesion on each knee that she would like checked today. ). She is most concerned about cancer on the spot on her knee.  She has personal and family history of cancer.  She would like this biopsied today.  The following portions of the chart were reviewed this encounter and updated as appropriate:   Tobacco  Allergies  Meds  Problems  Med Hx  Surg Hx  Fam Hx     Review of Systems:  No other skin or systemic complaints except as noted in HPI or Assessment and Plan.  Objective  Well appearing patient in no apparent distress; mood and affect are within normal limits.  A focused examination was performed including face, knees. Relevant physical exam findings are noted in the Assessment and Plan.  face Rhytides and volume loss.      Left Knee 1.24m brown macule     Right Knee Brown macule   Assessment & Plan  Elastosis of skin face  Botox 50 units injected today to: - Frown complex 20 units - Brow lift 5 units x 2, total of 10 units - Crow's feet 10 units x 2, total of 20 units  Filling material injection - face Location: Frown complex, brow lift bilateral, crow's feet bilateral  Informed consent: Discussed risks (infection, pain, bleeding, bruising, swelling, allergic reaction, paralysis of nearby muscles, eyelid droop, double vision, neck weakness, difficulty breathing, headache, undesirable cosmetic result, and need for additional treatment) and benefits of the procedure, as well as the alternatives.  Informed consent was obtained.  Preparation: The area was cleansed with alcohol.  Procedure Details:  Botox was injected into the dermis with a 30-gauge needle. Pressure applied to any bleeding. Ice packs offered for swelling.  Lot Number:  [id number]:  07/24  Total Units  Injected:  50  Plan: Patient was instructed to remain upright for 4 hours. Patient was instructed to avoid massaging the face and avoid vigorous exercise for the rest of the day. Tylenol may be used for headache.  Allow 2 weeks before returning to clinic for additional dosing as needed. Patient will call for any problems.   Neoplasm of skin Left Knee  Skin / nail biopsy Type of biopsy: punch   Informed consent: discussed and consent obtained   Timeout: patient name, date of birth, surgical site, and procedure verified   Procedure prep:  Patient was prepped and draped in usual sterile fashion (The patient was cleaned and prepped) Prep type:  Isopropyl alcohol Anesthesia: the lesion was anesthetized in a standard fashion   Anesthetic:  1% lidocaine w/ epinephrine 1-100,000 buffered w/ 8.4% NaHCO3 Punch size:  2 mm Suture size:  4-0 Suture type: nylon   Hemostasis achieved with: suture, pressure and aluminum chloride   Outcome: patient tolerated procedure well   Post-procedure details: sterile dressing applied and wound care instructions given   Dressing type: bandage, pressure dressing and bacitracin    Specimen 1 - Surgical pathology Differential Diagnosis: d48.5 Nevus vs Dysplastic Nevus  Check Margins: yes 1.050mbrown macule  Nevus Right Knee  observe for changes. Plan removal depending on pathology from the left knee biopsy done today.  Return in about 8 days (around 08/27/2021) for suture removal.  I, SoOthelia PullingRMA, am acting as scribe for DaSarina SerMD . Documentation:  I have reviewed the above documentation for accuracy and completeness, and I agree with the above.  Sarina Ser, MD

## 2021-08-22 ENCOUNTER — Telehealth: Payer: Self-pay

## 2021-08-22 NOTE — Telephone Encounter (Signed)
Patient informed of pathology results 

## 2021-08-22 NOTE — Telephone Encounter (Signed)
-----   Message from Ralene Bathe, MD sent at 08/21/2021  7:29 PM EDT ----- Diagnosis Skin , left knee HEMANGIOMA  Benign hemangioma = collection of blood vessels No further treatment needed

## 2021-08-27 ENCOUNTER — Ambulatory Visit (INDEPENDENT_AMBULATORY_CARE_PROVIDER_SITE_OTHER): Payer: Medicare PPO | Admitting: Dermatology

## 2021-08-27 ENCOUNTER — Other Ambulatory Visit: Payer: Self-pay

## 2021-08-27 DIAGNOSIS — D1801 Hemangioma of skin and subcutaneous tissue: Secondary | ICD-10-CM

## 2021-08-27 NOTE — Patient Instructions (Signed)

## 2021-08-27 NOTE — Progress Notes (Signed)
   Follow-Up Visit   Subjective  Kiara Donovan is a 70 y.o. female who presents for the following: Follow-up (1 week biopsy follow up - Biopsy proven hemangioma).  The following portions of the chart were reviewed this encounter and updated as appropriate:   Tobacco  Allergies  Meds  Problems  Med Hx  Surg Hx  Fam Hx     Review of Systems:  No other skin or systemic complaints except as noted in HPI or Assessment and Plan.  Objective  Well appearing patient in no apparent distress; mood and affect are within normal limits.  A focused examination was performed including bilateral knees. Relevant physical exam findings are noted in the Assessment and Plan.  Left Knee, Right Knee Healing biopsy site of left knee. Brown macule of right knee.         Assessment & Plan  Hemangioma of skin left knee-biopsy-proven benign.  Reassured Advised the right knee lesion appears exactly the same.  Will observe for now if change will reevaluate.  Keep follow-up appointment Left Knee; Right Knee  Encounter for Removal of Sutures - Incision site at the left knee is clean, dry and intact - Wound cleansed, sutures removed, wound cleansed and steri strips applied.  - Discussed pathology results showing hemangioma  - Patient advised to keep steri-strips dry until they fall off. - Scars remodel for a full year. - Once steri-strips fall off, patient can apply over-the-counter silicone scar cream each night to help with scar remodeling if desired. - Patient advised to call with any concerns or if they notice any new or changing lesions.  Advised patient benign. She prefers to watch lesion of right knee for now. May plan biopsy with any changes.  Return for Follow up as scheduled.  I, Ashok Cordia, CMA, am acting as scribe for Sarina Ser, MD . Documentation: I have reviewed the above documentation for accuracy and completeness, and I agree with the above.  Sarina Ser, MD

## 2021-08-28 ENCOUNTER — Encounter: Payer: Self-pay | Admitting: Dermatology

## 2021-09-22 ENCOUNTER — Other Ambulatory Visit: Payer: Self-pay

## 2021-09-22 ENCOUNTER — Emergency Department
Admission: EM | Admit: 2021-09-22 | Discharge: 2021-09-22 | Disposition: A | Discharge status: Home / Self Care | Payer: Medicare PPO | Attending: Emergency Medicine | Admitting: Emergency Medicine

## 2021-09-22 DIAGNOSIS — K645 Perianal venous thrombosis: Secondary | ICD-10-CM | POA: Diagnosis not present

## 2021-09-22 DIAGNOSIS — Z853 Personal history of malignant neoplasm of breast: Secondary | ICD-10-CM | POA: Insufficient documentation

## 2021-09-22 DIAGNOSIS — K625 Hemorrhage of anus and rectum: Secondary | ICD-10-CM | POA: Diagnosis present

## 2021-09-22 LAB — TYPE AND SCREEN
ABO/RH(D): O POS
Antibody Screen: NEGATIVE

## 2021-09-22 LAB — COMPREHENSIVE METABOLIC PANEL
ALT: 24 U/L (ref 0–44)
AST: 31 U/L (ref 15–41)
Albumin: 4.2 g/dL (ref 3.5–5.0)
Alkaline Phosphatase: 45 U/L (ref 38–126)
Anion gap: 7 (ref 5–15)
BUN: 19 mg/dL (ref 8–23)
CO2: 30 mmol/L (ref 22–32)
Calcium: 9.3 mg/dL (ref 8.9–10.3)
Chloride: 99 mmol/L (ref 98–111)
Creatinine, Ser: 0.73 mg/dL (ref 0.44–1.00)
GFR, Estimated: >60 mL/min (ref >60)
Glucose, Bld: 102 mg/dL — ABNORMAL HIGH (ref 70–99)
Potassium: 4.3 mmol/L (ref 3.5–5.1)
Sodium: 136 mmol/L (ref 135–145)
Total Bilirubin: 0.8 mg/dL (ref 0.3–1.2)
Total Protein: 8 g/dL (ref 6.5–8.1)

## 2021-09-22 LAB — CBC
HCT: 38.8 % (ref 36.0–46.0)
Hemoglobin: 13.2 g/dL (ref 12.0–15.0)
MCH: 31.8 pg (ref 26.0–34.0)
MCHC: 34 g/dL (ref 30.0–36.0)
MCV: 93.5 fL (ref 80.0–100.0)
Platelets: 202 10*3/uL (ref 150–400)
RBC: 4.15 MIL/uL (ref 3.87–5.11)
RDW: 12.9 % (ref 11.5–15.5)
WBC: 7.3 10*3/uL (ref 4.0–10.5)
nRBC: 0 % (ref 0.0–0.2)

## 2021-09-22 MED ORDER — POLYETHYLENE GLYCOL 3350 17 G PO PACK: 17.0000 g | PACK | Freq: Every day | ORAL | 0 refills | Status: DC

## 2021-09-22 MED ORDER — PREPARATION H 0.25-14-74.9 % RE OINT
1.0000 | TOPICAL_OINTMENT | Freq: Two times a day (BID) | RECTAL | 0 refills | Status: DC | PRN
Start: 2021-09-22 — End: 2023-08-12

## 2021-09-22 NOTE — ED Notes (Signed)
UA @ bedside.

## 2021-09-22 NOTE — ED Provider Notes (Signed)
[facility]  ____________________________________________   Event Date/Time   First MD Initiated Contact with Patient 09/22/21 1210     (approximate)  I have reviewed the triage vital signs and the nursing notes.   HISTORY  Chief Complaint Rectal Bleeding    HPI Kiara Donovan is a 70 y.o. female past medical history of breast cancer who presents with rectal bleeding.  Symptoms started 2 days ago.  She endorses right red blood in the toilet and mixed in with her stool.  Has had about 1 episode per day including today.  She denies associated abdominal pain, fevers chills.  No lightheadedness shortness of breath or chest pain.  Patient has had rectal bleeding in the past but not as much she says.  Says that in the past this was due to an infection.  She denies other bleeding.  Not on any blood thinners.  Patient does have a history of internal hemorrhoids.  Has had significant constipation lately.         Past Medical History:  Diagnosis Date   Actinic keratosis    Cancer ([place]) 10/2016   left breast lumpectomy   Rheumatic disease    Vestibulitis, vulvar    seen at [facility]    Patient Active Problem List   Diagnosis Date Noted   Moderate tricuspid insufficiency 02/21/2019   Encounter for general adult medical examination with abnormal findings 12/24/2017   Bilateral carotid artery stenosis 05/10/2017   Syncope 03/31/2017   Closed fracture of nasal bones 01/13/2017   Deviated nasal septum 01/13/2017   Facial trauma, sequela 01/13/2017   History of nasal surgery 01/13/2017   Hypertrophy of both inferior nasal turbinates 01/13/2017   Other chronic sinusitis 01/13/2017   Tongue irritation 11/30/2016   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 Baruch Merl, normal   [place]      Prior to Admission medications   Medication Sig Start Date End Date Taking? Authorizing Provider  phenylephrine-shark liver oil-mineral oil-petrolatum (PREPARATION H) 0.25-14-74.9 % rectal ointment Place 1 application rectally 2 (two) times daily as needed for hemorrhoids. 09/22/21  Yes Rada Hay, MD  polyethylene glycol (MIRALAX) 17 g packet Take 17 g by mouth daily. 09/22/21  Yes Rada Hay, MD  ALPRAZolam Duanne Moron) 0.25 MG tablet  05/20/20   [provider]  amoxicillin (AMOXIL) 500 MG capsule  07/18/20   [provider]  anastrozole (ARIMIDEX) 1 MG tablet Take 1 mg by mouth daily. 11/23/17   [provider]  aspirin EC 81 MG tablet Take 81 mg by mouth daily.    [provider]  atorvastatin (LIPITOR) 20 MG tablet TAKE 1 TABLET BY MOUTH DAILY 01/15/20   Leone Haven, MD  Biotin 1 MG CAPS Take by mouth.    [provider]  Cholecalciferol (D3 ADULT PO) Take by mouth.    [provider]  clorazepate (TRANXENE) 7.5 MG tablet 1.5 tablets by mouth daily prn 12/24/17   Leone Haven, MD  Multiple Vitamin (MULTIVITAMIN) capsule Take 1 capsule by mouth daily.      [provider]  pantoprazole (  PROTONIX) 20 MG tablet Take by mouth. 03/11/20   [provider]  Probiotic Product ([place]) Take by mouth.    [provider]  traZODone (DESYREL) 50 MG tablet  03/23/20   [provider]  valACYclovir (VALTREX) 500 MG tablet  05/13/20   [provider]    Allergies Neomycin-bacitracin zn-polymyx, Tape, and Trazodone  Family History  Problem Relation Age of Onset   Cancer Mother        cervical and melanoma   Thyroid nodules Mother    Cancer Father        prostate cancer   Heart  disease Father        s/p CABG   Cancer Sister        melanoma   Heart disease Maternal Grandmother    Heart disease Paternal Grandmother    Heart disease Paternal Grandfather    Miscarriages / Stillbirths Paternal Grandfather     Social History Social History   Tobacco Use   Smoking status: Never   Smokeless tobacco: Never  Substance Use Topics   Alcohol use: No   Drug use: No    Review of Systems   Review of Systems  Constitutional:  Negative for chills and fever.  Respiratory:  Negative for shortness of breath.   Gastrointestinal:  Positive for blood in stool and constipation. Negative for abdominal pain, nausea and vomiting.  Neurological:  Negative for light-headedness.  All other systems reviewed and are negative.  Physical Exam Updated Vital Signs BP (!) 142/84   Pulse 77   Temp 98.4 F (36.9 C) (Oral)   Resp 18   Ht 5' 3.5" (1.613 m)   Wt 59 kg   SpO2 97%   BMI 22.67 kg/m   Physical Exam Vitals and nursing note reviewed.  Constitutional:      General: She is not in acute distress.    Appearance: Normal appearance.  HENT:     Head: Normocephalic and atraumatic.  Eyes:     General: No scleral icterus.    Conjunctiva/sclera: Conjunctivae normal.  Pulmonary:     Effort: Pulmonary effort is normal. No respiratory distress.     Breath sounds: No stridor.  Abdominal:     General: Abdomen is flat. There is no distension.     Palpations: Abdomen is soft.     Tenderness: There is no abdominal tenderness. There is no guarding.  Genitourinary:    Comments: External hemorrhoids noted, empty rectal vault Musculoskeletal:        General: No deformity or signs of injury.     Cervical back: Normal range of motion.  Skin:    General: Skin is dry.     Coloration: Skin is not jaundiced or pale.  Neurological:     General: No focal deficit present.     Mental Status: She is alert and oriented to person, place, and time. Mental status is at baseline.   Psychiatric:        Mood and Affect: Mood normal.        Behavior: Behavior normal.     LABS (all labs ordered are listed, but only abnormal results are displayed)  Labs Reviewed  COMPREHENSIVE METABOLIC PANEL - Abnormal; Notable for the following components:      Result Value   Glucose, Bld 102 (*)    All other components within normal limits  CBC  POC OCCULT BLOOD, ED  TYPE AND SCREEN   ____________________________________________  EKG  N/a ____________________________________________  RADIOLOGY I, Madelin Headings, personally viewed and evaluated these images (plain radiographs) as part of my medical decision making, as well as reviewing the written report by the radiologist.  ED MD interpretation:  n/a    ____________________________________________   PROCEDURES  Procedure(s) performed (including Critical Care):  Procedures   ____________________________________________   INITIAL IMPRESSION / [place] / ED COURSE     Patient is a 70 year old female presents with 3 days of rectal bleeding.  Patient shows me photos and there is bright red blood in the toilet and is not mixed in with stool.  On exam she is well-appearing, normal vital signs abdomen is benign.  She has external hemorrhoids on rectal exam and there is no stool in the rectal vault.  Patient's hemoglobin is 13.2.  She has no leukocytosis rest of her labs are reassuring.  I suspect that her bleeding is hemorrhoidal in etiology.  Given this is been going on for 3 days she is stable with normal hemoglobin you feel that she is appropriate for outpatient management.  I discussed with the patient the option for admission for serial hemoglobins and monitoring versus outpatient management with return if worsening.  Patient preferred to be discharged.  She seems reliable and I am comfortable with this plan.  Will prescribe MiraLAX and Preparation H for hemorrhoids.       ____________________________________________   FINAL CLINICAL IMPRESSION(S) / ED DIAGNOSES  Final diagnoses:  Rectal bleeding     ED Discharge Orders          Ordered    polyethylene glycol (MIRALAX) 17 g packet  Daily        09/22/21 1311    phenylephrine-shark liver oil-mineral oil-petrolatum (PREPARATION H) 0.25-14-74.9 % rectal ointment  2 times daily PRN        09/22/21 1311             Note:  This document was prepared using Dragon voice recognition software and may include unintentional dictation errors.    Rada Hay, MD 09/22/21 [phone]

## 2021-09-22 NOTE — ED Triage Notes (Signed)
Pt comes with c/o rectal bleeding. Pt states it is bright red. Pt states since Saturday. Pt states little cramping.

## 2021-09-22 NOTE — Discharge Instructions (Addendum)
Your blood counts were normal today.  I suspect that the rectal bleeding is related to your hemorrhoids.  Please start taking a stool softener and you can use the ointment for pain.  Please return to the emergency department if you develop increasing bleeding, or feeling lightheaded or develop any symptoms that are concerning to you.  Please also follow-up with gastroenterology as you should have a colonoscopy given the change in your stool caliber.

## 2021-11-28 ENCOUNTER — Encounter: Payer: Self-pay | Admitting: Internal Medicine

## 2021-12-01 ENCOUNTER — Encounter
Admission: RE | Disposition: A | Discharge status: Home / Self Care | Payer: Self-pay | Source: Home / Self Care | Attending: Gastroenterology

## 2021-12-01 ENCOUNTER — Ambulatory Visit: Payer: Medicare PPO | Admitting: Anesthesiology

## 2021-12-01 ENCOUNTER — Encounter: Payer: Self-pay | Admitting: Internal Medicine

## 2021-12-01 ENCOUNTER — Other Ambulatory Visit: Payer: Self-pay

## 2021-12-01 ENCOUNTER — Ambulatory Visit
Admission: RE | Admit: 2021-12-01 | Discharge: 2021-12-01 | Disposition: A | Discharge status: Home / Self Care | Payer: Medicare PPO | Attending: Gastroenterology | Admitting: Gastroenterology

## 2021-12-01 DIAGNOSIS — K219 Gastro-esophageal reflux disease without esophagitis: Secondary | ICD-10-CM | POA: Diagnosis not present

## 2021-12-01 DIAGNOSIS — K625 Hemorrhage of anus and rectum: Secondary | ICD-10-CM | POA: Diagnosis present

## 2021-12-01 DIAGNOSIS — Z8711 Personal history of peptic ulcer disease: Secondary | ICD-10-CM | POA: Insufficient documentation

## 2021-12-01 DIAGNOSIS — R519 Headache, unspecified: Secondary | ICD-10-CM | POA: Insufficient documentation

## 2021-12-01 DIAGNOSIS — R1310 Dysphagia, unspecified: Secondary | ICD-10-CM | POA: Insufficient documentation

## 2021-12-01 DIAGNOSIS — K222 Esophageal obstruction: Secondary | ICD-10-CM | POA: Diagnosis not present

## 2021-12-01 DIAGNOSIS — K640 First degree hemorrhoids: Secondary | ICD-10-CM | POA: Diagnosis not present

## 2021-12-01 HISTORY — DX: Hyperlipidemia, unspecified: E78.5

## 2021-12-01 HISTORY — DX: Age-related osteoporosis without current pathological fracture: M81.0

## 2021-12-01 HISTORY — PX: ESOPHAGOGASTRODUODENOSCOPY (EGD) WITH PROPOFOL: SHX5813

## 2021-12-01 HISTORY — DX: Anxiety disorder, unspecified: F41.9

## 2021-12-01 HISTORY — DX: Gastric ulcer, unspecified as acute or chronic, without hemorrhage or perforation: K25.9

## 2021-12-01 HISTORY — PX: COLONOSCOPY WITH PROPOFOL: SHX5780

## 2021-12-01 HISTORY — DX: Headache, unspecified: R51.9

## 2021-12-01 HISTORY — DX: Other specified disorders of bladder: N32.89

## 2021-12-01 SURGERY — COLONOSCOPY WITH PROPOFOL
Anesthesia: General

## 2021-12-01 MED ORDER — SODIUM CHLORIDE 0.9 % IV SOLN: INTRAVENOUS | Status: DC

## 2021-12-01 MED ORDER — PROPOFOL 500 MG/50ML IV EMUL
INTRAVENOUS | Status: DC | PRN
  Administered 2021-12-01: 120 ug/kg/min via INTRAVENOUS

## 2021-12-01 MED ORDER — PROPOFOL 500 MG/50ML IV EMUL
INTRAVENOUS | Status: AC
  Filled 2021-12-01: qty 50

## 2021-12-01 MED ORDER — LIDOCAINE HCL (CARDIAC) PF 100 MG/5ML IV SOSY
PREFILLED_SYRINGE | INTRAVENOUS | Status: DC | PRN
  Administered 2021-12-01: 30 mg via INTRAVENOUS

## 2021-12-01 NOTE — Op Note (Signed)
[facility] Gastroenterology Patient Name: Kiara Donovan Procedure Date: 12/01/2021 1:10 PM MRN: [id number] Account #: [id number] Date of Birth: [birth year] Admit Type: Outpatient Age: 70 Room: [facility] ENDO ROOM 3 Gender: Female Note Status: Finalized Instrument Name: Jasper Riling [id number] Procedure:             Colonoscopy Indications:           Rectal bleeding Providers:             Andrey Farmer MD, MD Referring MD:          Vicenta Aly (Referring MD) Medicines:             Monitored Anesthesia Care Complications:         No immediate complications. Procedure:             Pre-Anesthesia Assessment:                        - Prior to the procedure, a History and Physical was                         performed, and patient medications and allergies were                         reviewed. The patient is competent. The risks and                         benefits of the procedure and the sedation options and                         risks were discussed with the patient. All questions                         were answered and informed consent was obtained.                         Patient identification and proposed procedure were                         verified by the physician, the nurse, the                         anesthesiologist, the anesthetist and the technician                         in the pre-procedure area in the endoscopy suite.                         Mental Status Examination: alert and oriented. Airway                         Examination: normal oropharyngeal airway and neck                         mobility. Respiratory Examination: clear to                         auscultation. CV Examination: normal. Prophylactic  Antibiotics: The patient does not require prophylactic                         antibiotics. Prior Anticoagulants: The patient has                         taken no previous anticoagulant or antiplatelet                          agents. ASA Grade Assessment: I - A normal, healthy                         patient. After reviewing the risks and benefits, the                         patient was deemed in satisfactory condition to                         undergo the procedure. The anesthesia plan was to use                         monitored anesthesia care (MAC). Immediately prior to                         administration of medications, the patient was                         re-assessed for adequacy to receive sedatives. The                         heart rate, respiratory rate, oxygen saturations,                         blood pressure, adequacy of pulmonary ventilation, and                         response to care were monitored throughout the                         procedure. The physical status of the patient was                         re-assessed after the procedure.                        After obtaining informed consent, the colonoscope was                         passed under direct vision. Throughout the procedure,                         the patient's blood pressure, pulse, and oxygen                         saturations were monitored continuously. The                         Colonoscope was introduced through the anus and  advanced to the the terminal ileum. The colonoscopy                         was performed without difficulty. The patient                         tolerated the procedure well. The quality of the bowel                         preparation was good. Findings:      The perianal and digital rectal examinations were normal.      The terminal ileum appeared normal.      Internal hemorrhoids were found during retroflexion. The hemorrhoids       were Grade I (internal hemorrhoids that do not prolapse).      The exam was otherwise without abnormality on direct and retroflexion       views. Impression:            - The examined portion of the ileum was normal.                         - Internal hemorrhoids.                        - The examination was otherwise normal on direct and                         retroflexion views.                        - No specimens collected. Recommendation:        - Discharge patient to home.                        - Resume previous diet.                        - Continue present medications.                        - Repeat colonoscopy is not recommended due to current                         age (62 years or older) for screening purposes.                        - Return to referring physician as previously                         scheduled. Procedure Code(s):     --- Professional ---                        [phone], Colonoscopy, flexible; diagnostic, including                         collection of specimen(s) by brushing or washing, when                         performed (separate procedure) Diagnosis Code(s):     --- Professional ---  K64.0, First degree hemorrhoids                        K62.5, Hemorrhage of anus and rectum CPT copyright 2019 American Medical Association. All rights reserved. The codes documented in this report are preliminary and upon coder review may  be revised to meet current compliance requirements. Andrey Farmer MD, MD 12/01/2021 1:48:32 PM Number of Addenda: 0 Note Initiated On: 12/01/2021 1:10 PM Scope Withdrawal Time: 0 hours 6 minutes 19 seconds  Total Procedure Duration: 0 hours 12 minutes 3 seconds  Estimated Blood Loss:  Estimated blood loss: none.      [facility]

## 2021-12-01 NOTE — Op Note (Signed)
[facility] Gastroenterology Patient Name: Kiara Donovan Procedure Date: 12/01/2021 1:11 PM MRN: [id number] Account #: [id number] Date of Birth: 08-Feb-1951 Admit Type: Outpatient Age: 70 Room: [facility] ENDO ROOM 3 Gender: Female Note Status: Finalized Instrument Name: Upper Endoscope [id number] Procedure:             Upper GI endoscopy Indications:           Dysphagia, Gastro-esophageal reflux disease Providers:             Andrey Farmer MD, MD Referring MD:          Irven Easterly. Kary Kos, MD (Referring MD) Medicines:             Monitored Anesthesia Care Complications:         No immediate complications. Estimated blood loss:                         Minimal. Procedure:             Pre-Anesthesia Assessment:                        - Prior to the procedure, a History and Physical was                         performed, and patient medications and allergies were                         reviewed. The patient is competent. The risks and                         benefits of the procedure and the sedation options and                         risks were discussed with the patient. All questions                         were answered and informed consent was obtained.                         Patient identification and proposed procedure were                         verified by the physician, the nurse, the                         anesthesiologist, the anesthetist and the technician                         in the pre-procedure area in the endoscopy suite.                         Mental Status Examination: alert and oriented. Airway                         Examination: normal oropharyngeal airway and neck                         mobility. Respiratory Examination: clear to  auscultation. CV Examination: normal. Prophylactic                         Antibiotics: The patient does not require prophylactic                         antibiotics. Prior Anticoagulants:  The patient has                         taken no previous anticoagulant or antiplatelet                         agents. ASA Grade Assessment: I - A normal, healthy                         patient. After reviewing the risks and benefits, the                         patient was deemed in satisfactory condition to                         undergo the procedure. The anesthesia plan was to use                         monitored anesthesia care (MAC). Immediately prior to                         administration of medications, the patient was                         re-assessed for adequacy to receive sedatives. The                         heart rate, respiratory rate, oxygen saturations,                         blood pressure, adequacy of pulmonary ventilation, and                         response to care were monitored throughout the                         procedure. The physical status of the patient was                         re-assessed after the procedure.                        After obtaining informed consent, the endoscope was                         passed under direct vision. Throughout the procedure,                         the patient's blood pressure, pulse, and oxygen                         saturations were monitored continuously. The Endoscope  was introduced through the mouth, and advanced to the                         second part of duodenum. The upper GI endoscopy was                         accomplished without difficulty. The patient tolerated                         the procedure well. Findings:      One benign-appearing, intrinsic mild stenosis was found. This stenosis       measured less than one cm (in length). The stenosis was traversed. A TTS       dilator was passed through the scope. Dilation with a 15-16.5-18 mm       balloon dilator was performed to 18 mm. The dilation site was examined       and showed moderate improvement in luminal  narrowing. Estimated blood       loss was minimal.      Normal mucosa was found in the entire esophagus. Biopsies were obtained       from the proximal and distal esophagus with cold forceps for histology       of suspected eosinophilic esophagitis. Estimated blood loss was minimal.      The entire examined stomach was normal. Biopsies were taken with a cold       forceps for histology. Estimated blood loss was minimal.      The examined duodenum was normal. Impression:            - Benign-appearing esophageal stenosis. Dilated.                        - Normal mucosa was found in the entire esophagus.                         Biopsied.                        - Normal stomach. Biopsied.                        - Normal examined duodenum. Recommendation:        - Await pathology results.                        - Perform a colonoscopy today. Procedure Code(s):     --- Professional ---                        [id number], Esophagogastroduodenoscopy, flexible,                         transoral; with transendoscopic balloon dilation of                         esophagus (less than 30 mm diameter) Diagnosis Code(s):     --- Professional ---                        K22.2, Esophageal obstruction  R13.10, Dysphagia, unspecified                        K21.9, Gastro-esophageal reflux disease without                         esophagitis CPT copyright 2019 American Medical Association. All rights reserved. The codes documented in this report are preliminary and upon coder review may  be revised to meet current compliance requirements. Andrey Farmer MD, MD 12/01/2021 1:46:15 PM Number of Addenda: 0 Note Initiated On: 12/01/2021 1:11 PM Estimated Blood Loss:  Estimated blood loss was minimal.      [facility]

## 2021-12-01 NOTE — H&P (Signed)
Outpatient short stay form Pre-procedure 12/01/2021  Lesly Rubenstein, MD  Primary Physician: [place] Pink, MD  Reason for visit:  Dysphagia and rectal bleeding  History of present illness:   70 y/o lady with history of HLD here for EGD/Colonoscopy for dysphagia to solids and colonoscopy for recent history of rectal bleeding. No blood thinners. No significant abdominal surgeries. No family history of GI malignancies.   Current Facility-Administered Medications:    0.9 %  sodium chloride infusion, , Intravenous, Continuous, Tajon Moring, Hilton Cork, MD, Last Rate: 20 mL/hr at 12/01/21 1215, New Bag at 12/01/21 1215  Medications Prior to Admission  Medication Sig Dispense Refill Last Dose   alendronate (FOSAMAX) 70 MG tablet Take 70 mg by mouth once a week. Take with a full glass of water on an empty stomach.   Past Week   ALPRAZolam (XANAX) 0.25 MG tablet    Past Week   amoxicillin (AMOXIL) 500 MG capsule    Past Week   anastrozole (ARIMIDEX) 1 MG tablet Take 1 mg by mouth daily.  1 Past Week   aspirin EC 81 MG tablet Take 81 mg by mouth daily.   Past Week   atorvastatin (LIPITOR) 20 MG tablet TAKE 1 TABLET BY MOUTH DAILY 90 tablet 3 Past Week   Biotin 1 MG CAPS Take by mouth.   Past Week   Cholecalciferol (D3 ADULT PO) Take by mouth.   Past Week   clorazepate (TRANXENE) 7.5 MG tablet 1.5 tablets by mouth daily prn 45 tablet 2 Past Week   Multiple Vitamin (MULTIVITAMIN) capsule Take 1 capsule by mouth daily.     Past Week   pantoprazole (PROTONIX) 20 MG tablet Take by mouth.   Past Week   phenylephrine-shark liver oil-mineral oil-petrolatum (PREPARATION H) 0.25-14-74.9 % rectal ointment Place 1 application rectally 2 (two) times daily as needed for hemorrhoids. 28 g 0 Past Week   polyethylene glycol (MIRALAX) 17 g packet Take 17 g by mouth daily. 14 each 0 Past Month   Probiotic Product ([place]) Take by mouth.   Past Month   valACYclovir (VALTREX) 500 MG tablet    Past  Week   traZODone (DESYREL) 50 MG tablet         Allergies  Allergen Reactions   Neomycin-Bacitracin Zn-Polymyx Other (See Comments)    Itchy; red eyes to drops    Tape Other (See Comments)    Other reaction(s): Other (See Comments) Skin irritation. Prefers paper tape. Skin irritation. Prefers paper tape.    Trazodone     Other reaction(s): Headache     Past Medical History:  Diagnosis Date   Actinic keratosis    Anxiety    Cancer ([place]) 10/2016   left breast lumpectomy   Headache    migraines   HLD (hyperlipidemia)    Osteoporosis    Rheumatic disease    Stomach ulcer    Ulcer of bladder    Vestibulitis, vulvar    seen at [facility]    Review of systems:  Otherwise negative.    Physical Exam  Gen: Alert, oriented. Appears stated age.  HEENT: PERRLA. Lungs: No respiratory distress CV: RRR Abd: soft, benign, no masses Ext: No edema    Planned procedures: Proceed with EGD/colonoscopy. The patient understands the nature of the planned procedure, indications, risks, alternatives and potential complications including but not limited to bleeding, infection, perforation, damage to internal organs and possible oversedation/side effects from anesthesia. The patient agrees and gives consent to proceed.  Please refer to procedure notes for findings, recommendations and patient disposition/instructions.     Lesly Rubenstein, MD [facility] Gastroenterology

## 2021-12-01 NOTE — Anesthesia Preprocedure Evaluation (Signed)
Anesthesia Evaluation  Patient identified by MRN, date of birth, ID band Patient awake    Reviewed: Allergy & Precautions, H&P , NPO status , Patient's Chart, lab work & pertinent test results, reviewed documented beta blocker date and time   Airway Mallampati: II   Neck ROM: full    Dental  (+) Poor Dentition   Pulmonary neg pulmonary ROS,    Pulmonary exam normal        Cardiovascular Exercise Tolerance: Good negative cardio ROS Normal cardiovascular exam Rhythm:regular Rate:Normal     Neuro/Psych  Headaches, negative psych ROS   GI/Hepatic Neg liver ROS, PUD,   Endo/Other  negative endocrine ROS  Renal/GU negative Renal ROS  negative genitourinary   Musculoskeletal   Abdominal   Peds  Hematology negative hematology ROS (+)   Anesthesia Other Findings Past Medical History: No date: Actinic keratosis No date: Anxiety 10/2016: Cancer ([place])     Comment:  left breast lumpectomy No date: Headache     Comment:  migraines No date: HLD (hyperlipidemia) No date: Osteoporosis No date: Rheumatic disease No date: Stomach ulcer No date: Ulcer of bladder No date: Vestibulitis, vulvar     Comment:  seen at [facility] Past Surgical History: No date: BREAST SURGERY; Left 12/21/2002: COLONOSCOPY     Comment:  Dr. Kalman Drape, Jefm Bryant, normal No date: MASTECTOMY No date: RHINOPLASTY No date: TONSILLECTOMY No date: VAGINAL DELIVERY BMI    Body Mass Index: 22.49 kg/m     Reproductive/Obstetrics negative OB ROS                             Anesthesia Physical Anesthesia Plan  ASA: 3  Anesthesia Plan: General   Post-op Pain Management:    Induction:   PONV Risk Score and Plan:   Airway Management Planned:   Additional Equipment:   Intra-op Plan:   Post-operative Plan:   Informed Consent: I have reviewed the patients History and Physical, chart, labs and discussed the procedure  including the risks, benefits and alternatives for the proposed anesthesia with the patient or authorized representative who has indicated his/her understanding and acceptance.     Dental Advisory Given  Plan Discussed with: CRNA  Anesthesia Plan Comments:         Anesthesia Quick Evaluation

## 2021-12-01 NOTE — Anesthesia Procedure Notes (Signed)
Date/Time: 12/01/2021 1:17 PM Performed by: Vaughan Sine Pre-anesthesia Checklist: Patient identified, Emergency Drugs available, Suction available, Patient being monitored and Timeout performed Patient Re-evaluated:Patient Re-evaluated prior to induction Oxygen Delivery Method: Nasal cannula Preoxygenation: Pre-oxygenation with 100% oxygen Induction Type: IV induction Airway Equipment and Method: Bite block Placement Confirmation: positive ETCO2 and CO2 detector

## 2021-12-01 NOTE — Interval H&P Note (Signed)
History and Physical Interval Note:  12/01/2021 1:01 PM  Kiara Donovan  has presented today for surgery, with the diagnosis of GERD, rectal bleeding, Dysphagia.  The various methods of treatment have been discussed with the patient and family. After consideration of risks, benefits and other options for treatment, the patient has consented to  Procedure(s): COLONOSCOPY WITH PROPOFOL (N/A) ESOPHAGOGASTRODUODENOSCOPY (EGD) WITH PROPOFOL (N/A) as a surgical intervention.  The patient's history has been reviewed, patient examined, no change in status, stable for surgery.  I have reviewed the patient's chart and labs.  Questions were answered to the patient's satisfaction.     Lesly Rubenstein Ok to proceed with EGD/Colonoscopy

## 2021-12-01 NOTE — Transfer of Care (Signed)
Immediate Anesthesia Transfer of Care Note  Patient: Kiara Donovan  Procedure(s) Performed: COLONOSCOPY WITH PROPOFOL ESOPHAGOGASTRODUODENOSCOPY (EGD) WITH PROPOFOL  Patient Location: PACU  Anesthesia Type:General  Level of Consciousness: awake and sedated  Airway & Oxygen Therapy: Patient Spontanous Breathing and Patient connected to nasal cannula oxygen  Post-op Assessment: Report given to RN and Post -op Vital signs reviewed and stable  Post vital signs: Reviewed and stable  Last Vitals:  Vitals Value Taken Time  BP    Temp    Pulse    Resp    SpO2      Last Pain:  Vitals:   12/01/21 1156  TempSrc: Temporal  PainSc: 0-No pain         Complications: No notable events documented.

## 2021-12-02 ENCOUNTER — Encounter: Payer: Self-pay | Admitting: Gastroenterology

## 2021-12-03 LAB — SURGICAL PATHOLOGY

## 2021-12-04 NOTE — Anesthesia Postprocedure Evaluation (Signed)
Anesthesia Post Note  Patient: KENYATTA GLOECKNER  Procedure(s) Performed: COLONOSCOPY WITH PROPOFOL ESOPHAGOGASTRODUODENOSCOPY (EGD) WITH PROPOFOL  Patient location during evaluation: PACU Anesthesia Type: General Level of consciousness: awake and alert Pain management: pain level controlled Vital Signs Assessment: post-procedure vital signs reviewed and stable Respiratory status: spontaneous breathing, nonlabored ventilation, respiratory function stable and patient connected to nasal cannula oxygen Cardiovascular status: blood pressure returned to baseline and stable Postop Assessment: no apparent nausea or vomiting Anesthetic complications: no   No notable events documented.   Last Vitals:  Vitals:   12/01/21 1414 12/01/21 1424  BP: 112/73 122/75  Pulse: 65 61  Resp: 16 (!) 22  Temp:    SpO2: 100% 100%    Last Pain:  Vitals:   12/02/21 0744  TempSrc:   PainSc: 0-No pain                 Molli Barrows

## 2021-12-30 ENCOUNTER — Encounter: Payer: Self-pay | Admitting: Dermatology

## 2021-12-30 ENCOUNTER — Other Ambulatory Visit: Payer: Self-pay

## 2021-12-30 ENCOUNTER — Ambulatory Visit: Payer: Medicare PPO | Admitting: Dermatology

## 2021-12-30 DIAGNOSIS — L578 Other skin changes due to chronic exposure to nonionizing radiation: Secondary | ICD-10-CM | POA: Diagnosis not present

## 2021-12-30 DIAGNOSIS — L570 Actinic keratosis: Secondary | ICD-10-CM

## 2021-12-30 DIAGNOSIS — L820 Inflamed seborrheic keratosis: Secondary | ICD-10-CM | POA: Diagnosis not present

## 2021-12-30 DIAGNOSIS — L988 Other specified disorders of the skin and subcutaneous tissue: Secondary | ICD-10-CM

## 2021-12-30 NOTE — Progress Notes (Addendum)
Follow-Up Visit   Subjective  Kiara Donovan is a 71 y.o. female who presents for the following: Facial Elastosis (Here for Botox injections. ). The patient has spots, moles and lesions to be evaluated, some may be new or changing and the patient has concerns that these could be cancer.  The following portions of the chart were reviewed this encounter and updated as appropriate:  Tobacco   Allergies   Meds   Problems   Med Hx   Surg Hx   Fam Hx      Review of Systems: No other skin or systemic complaints except as noted in HPI or Assessment and Plan.  Objective  Well appearing patient in no apparent distress; mood and affect are within normal limits.  A focused examination was performed including face. Relevant physical exam findings are noted in the Assessment and Plan.  forehead, glabella, crow's feet Rhytides and volume loss.      Right Lower Leg - Anterior x2, right neck x2, left thigh x1 (5) Erythematous keratotic or waxy stuck-on papule or plaque.  Dorsum of Nose Erythematous thin papules/macules with gritty scale.    Assessment & Plan  Elastosis of skin forehead, glabella, crow's feet  Botox 50 units injected today to: - Frown complex 20 units - Brow lift 5 units x 2, total of 10 units - Crow's feet 10 units x 2, total of 20 units  Botox Injection - forehead, glabella, crow's feet Location: See attached image  Informed consent: Discussed risks (infection, pain, bleeding, bruising, swelling, allergic reaction, paralysis of nearby muscles, eyelid droop, double vision, neck weakness, difficulty breathing, headache, undesirable cosmetic result, and need for additional treatment) and benefits of the procedure, as well as the alternatives.  Informed consent was obtained.  Preparation: The area was cleansed with alcohol.  Procedure Details:  Botox was injected into the dermis with a 30-gauge needle. Pressure applied to any bleeding. Ice packs offered for  swelling.  Lot Number:  [id number] Expiration:  01/2024  Total Units Injected:  50  Plan: Patient was instructed to remain upright for 4 hours. Patient was instructed to avoid massaging the face and avoid vigorous exercise for the rest of the day. Tylenol may be used for headache.  Allow 2 weeks before returning to clinic for additional dosing as needed. Patient will call for any problems.   Inflamed seborrheic keratosis (5) Right Lower Leg - Anterior x2, right neck x2, left thigh x1  Destruction of lesion - Right Lower Leg - Anterior x2, right neck x2, left thigh x1 Complexity: simple   Destruction method: cryotherapy   Informed consent: discussed and consent obtained   Timeout:  patient name, date of birth, surgical site, and procedure verified Lesion destroyed using liquid nitrogen: Yes   Region frozen until ice ball extended beyond lesion: Yes   Outcome: patient tolerated procedure well with no complications   Post-procedure details: wound care instructions given    AK (actinic keratosis) Dorsum of Nose  Actinic keratoses are precancerous spots that appear secondary to cumulative UV radiation exposure/sun exposure over time. They are chronic with expected duration over 1 year. A portion of actinic keratoses will progress to squamous cell carcinoma of the skin. It is not possible to reliably predict which spots will progress to skin cancer and so treatment is recommended to prevent development of skin cancer.  Recommend daily broad spectrum sunscreen SPF 30+ to sun-exposed areas, reapply every 2 hours as needed.  Recommend staying in the shade  or wearing long sleeves, sun glasses (UVA+UVB protection) and wide brim hats (4-inch brim around the entire circumference of the hat). Call for new or changing lesions.  Destruction of lesion - Dorsum of Nose Complexity: simple   Destruction method: cryotherapy   Informed consent: discussed and consent obtained   Timeout:  patient name, date  of birth, surgical site, and procedure verified Lesion destroyed using liquid nitrogen: Yes   Region frozen until ice ball extended beyond lesion: Yes   Outcome: patient tolerated procedure well with no complications   Post-procedure details: wound care instructions given    Actinic Damage - chronic, secondary to cumulative UV radiation exposure/sun exposure over time - diffuse scaly erythematous macules with underlying dyspigmentation - Recommend daily broad spectrum sunscreen SPF 30+ to sun-exposed areas, reapply every 2 hours as needed.  - Recommend staying in the shade or wearing long sleeves, sun glasses (UVA+UVB protection) and wide brim hats (4-inch brim around the entire circumference of the hat). - Call for new or changing lesions.  Return in about 4 months (around 04/29/2022) for Botox Follow Up.  I, Emelia Salisbury, CMA, am acting as scribe for Sarina Ser, MD. Documentation: I have reviewed the above documentation for accuracy and completeness, and I agree with the above.  Sarina Ser, MD

## 2021-12-30 NOTE — Patient Instructions (Signed)
Cryotherapy Aftercare  Wash gently with soap and water everyday.   Apply Vaseline and Band-Aid daily until healed.   Prior to procedure, discussed risks of blister formation, small wound, skin dyspigmentation, or rare scar following cryotherapy. Recommend Vaseline ointment to treated areas while healing.   If You Need Anything After Your Visit  If you have any questions or concerns for your doctor, please call our main line at [phone] and press option 4 to reach your doctor's medical assistant. If no one answers, please leave a voicemail as directed and we will return your call as soon as possible. Messages left after 4 pm will be answered the following business day.   You may also send [place] a message via [place]. We typically respond to MyChart messages within 1-2 business days.  For prescription refills, please ask your pharmacy to contact our office. Our fax number is [phone].  If you have an urgent issue when the clinic is closed that cannot wait until the next business day, you can page your doctor at the number below.    Please note that while we do our best to be available for urgent issues outside of office hours, we are not available 24/7.   If you have an urgent issue and are unable to reach [place], you may choose to seek medical care at your doctor's office, retail clinic, urgent care center, or emergency room.  If you have a medical emergency, please immediately call 911 or go to the emergency department.  Pager Numbers  - Dr. Nehemiah Massed: [phone]  - Dr. Laurence Ferrari: [phone]  - Dr. Nicole Kindred: [phone]  In the event of inclement weather, please call our main line at [phone] for an update on the status of any delays or closures.  Dermatology Medication Tips: Please keep the boxes that topical medications come in in order to help keep track of the instructions about where and how to use these. Pharmacies typically print the medication instructions only on the  boxes and not directly on the medication tubes.   If your medication is too expensive, please contact our office at [phone] option 4 or send [place] a message through [place].   We are unable to tell what your co-pay for medications will be in advance as this is different depending on your insurance coverage. However, we may be able to find a substitute medication at lower cost or fill out paperwork to get insurance to cover a needed medication.   If a prior authorization is required to get your medication covered by your insurance company, please allow [place] 1-2 business days to complete this process.  Drug prices often vary depending on where the prescription is filled and some pharmacies may offer cheaper prices.  The website www.goodrx.com contains coupons for medications through different pharmacies. The prices here do not account for what the cost may be with help from insurance (it may be cheaper with your insurance), but the website can give you the price if you did not use any insurance.  - You can print the associated coupon and take it with your prescription to the pharmacy.  - You may also stop by our office during regular business hours and pick up a GoodRx coupon card.  - If you need your prescription sent electronically to a different pharmacy, notify our office through [facility] or by phone at [phone] option 4.     Si Usted Necesita Algo Despus de Su Visita  Tambin puede enviarnos un mensaje a [place] Cruel  de MyChart. Por lo general respondemos a los mensajes de MyChart en el transcurso de 1 a 2 das hbiles.  Para renovar recetas, por favor pida a su farmacia que se ponga en contacto con nuestra oficina. Harland Dingwall de fax es [place] [phone].  Si tiene un asunto urgente cuando la clnica est cerrada y que no puede esperar hasta el siguiente da hbil, puede llamar/localizar a su doctor(a) al nmero que aparece a continuacin.   Por favor, tenga en cuenta que  aunque hacemos todo lo posible para estar disponibles para asuntos urgentes fuera del horario de [place], no estamos disponibles las 24 horas del da, los 7 das de la [place].   Si tiene un problema urgente y no puede comunicarse con nosotros, puede optar por buscar atencin mdica  en el consultorio de su doctor(a), en una clnica privada, en un centro de atencin urgente o en una sala de emergencias.  Si tiene Engineering geologist, por favor llame inmediatamente al 911 o vaya a la sala de emergencias.  Nmeros de bper  - Dr. Nehemiah Massed: [phone]  - Dra. Moye: [phone]  - Dra. Nicole Kindred: [phone]  En caso de inclemencias del [place], por favor llame a Johnsie Kindred principal al [phone] para una actualizacin sobre el [place] de cualquier retraso o cierre.  Consejos para la medicacin en dermatologa: Por favor, guarde las cajas en las que vienen los medicamentos de uso tpico para ayudarle a seguir las instrucciones sobre dnde y cmo usarlos. Las farmacias generalmente imprimen las instrucciones del medicamento slo en las cajas y no directamente en los tubos del [place].   Si su medicamento es muy caro, por favor, pngase en contacto con Zigmund Daniel llamando al [phone] y presione la opcin 4 o envenos un mensaje a travs de Pharmacist, community.   No podemos decirle cul ser su copago por los medicamentos por adelantado ya que esto es diferente dependiendo de la cobertura de su seguro. Sin embargo, es posible que podamos encontrar un medicamento sustituto a Electrical engineer un formulario para que el seguro cubra el medicamento que se considera necesario.   Si se requiere una autorizacin previa para que su compaa de seguros [place] su medicamento, por favor permtanos de 1 a 2 das hbiles para completar este proceso.  Los precios de los medicamentos varan con frecuencia dependiendo del Environmental consultant de dnde se surte la receta y alguna farmacias pueden ofrecer precios ms  baratos.  El sitio web www.goodrx.com tiene cupones para medicamentos de Airline pilot. Los precios aqu no tienen en cuenta lo que podra costar con la ayuda del seguro (puede ser ms barato con su seguro), pero el sitio web puede darle el precio si no utiliz Research scientist (physical sciences).  - Puede imprimir el cupn correspondiente y llevarlo con su receta a la farmacia.  - Tambin puede pasar por nuestra oficina durante el horario de atencin regular y Charity fundraiser una tarjeta de cupones de GoodRx.  - Si necesita que su receta se enve electrnicamente a una farmacia diferente, informe a nuestra oficina a travs de MyChart de [place] o por telfono llamando al [phone] y presione la opcin 4.

## 2021-12-31 ENCOUNTER — Encounter: Payer: Self-pay | Admitting: Dermatology

## 2022-05-12 ENCOUNTER — Ambulatory Visit: Payer: Medicare PPO | Admitting: Dermatology

## 2022-05-12 DIAGNOSIS — L821 Other seborrheic keratosis: Secondary | ICD-10-CM

## 2022-05-12 DIAGNOSIS — L988 Other specified disorders of the skin and subcutaneous tissue: Secondary | ICD-10-CM

## 2022-05-12 DIAGNOSIS — L570 Actinic keratosis: Secondary | ICD-10-CM

## 2022-05-12 DIAGNOSIS — L578 Other skin changes due to chronic exposure to nonionizing radiation: Secondary | ICD-10-CM | POA: Diagnosis not present

## 2022-05-12 DIAGNOSIS — L814 Other melanin hyperpigmentation: Secondary | ICD-10-CM

## 2022-05-12 NOTE — Patient Instructions (Addendum)

## 2022-05-12 NOTE — Progress Notes (Signed)
Follow-Up Visit   Subjective  Kiara Donovan is a 71 y.o. female who presents for the following: Patient presents for Botox.  She also has noticed some rough areas on her face she would like evaluated.  She has history of precancerous actinic keratoses treated in the past. The patient has spots, moles and lesions to be evaluated, some may be new or changing and the patient has concerns that these could be cancer.  The following portions of the chart were reviewed this encounter and updated as appropriate:   Tobacco  Allergies  Meds  Problems  Med Hx  Surg Hx  Fam Hx     Review of Systems:  No other skin or systemic complaints except as noted in HPI or Assessment and Plan.  Objective  Well appearing patient in no apparent distress; mood and affect are within normal limits.  A focused examination was performed including face, back. Relevant physical exam findings are noted in the Assessment and Plan.  face Rhytides and volume loss.      nose x 3, L brow x 1 (4) Pink scaly macules   Assessment & Plan   Seborrheic Keratoses - Stuck-on, waxy, tan-brown papules and/or plaques  - Benign-appearing - Discussed benign etiology and prognosis. - Observe - Call for any change - upper back  Lentigines - Scattered tan macules - Due to sun exposure - Benign-appering, observe - Recommend daily broad spectrum sunscreen SPF 30+ to sun-exposed areas, reapply every 2 hours as needed. - Call for any changes - upper back  Actinic Damage - chronic, secondary to cumulative UV radiation exposure/sun exposure over time - diffuse scaly erythematous macules with underlying dyspigmentation - Recommend daily broad spectrum sunscreen SPF 30+ to sun-exposed areas, reapply every 2 hours as needed.  - Recommend staying in the shade or wearing long sleeves, sun glasses (UVA+UVB protection) and wide brim hats (4-inch brim around the entire circumference of the hat). - Call for new or  changing lesions.   Elastosis of skin face  Botox 50 units injected today to: - Frown complex 20 units - Brow lift 5 units x 2, total of 10 units - Crow's feet 10 units x 2, total of 20 units  Botox Injection - face Location: Frown complex, brow lift, crow's feet  Informed consent: Discussed risks (infection, pain, bleeding, bruising, swelling, allergic reaction, paralysis of nearby muscles, eyelid droop, double vision, neck weakness, difficulty breathing, headache, undesirable cosmetic result, and need for additional treatment) and benefits of the procedure, as well as the alternatives.  Informed consent was obtained.  Preparation: The area was cleansed with alcohol.  Procedure Details:  Botox was injected into the dermis with a 30-gauge needle. Pressure applied to any bleeding. Ice packs offered for swelling.  Lot Number:  [id number] Expiration:  03/2024  Total Units Injected:  50  Plan: Patient was instructed to remain upright for 4 hours. Patient was instructed to avoid massaging the face and avoid vigorous exercise for the rest of the day. Tylenol may be used for headache.  Allow 2 weeks before returning to clinic for additional dosing as needed. Patient will call for any problems.   AK (actinic keratosis) (4) nose x 3, L brow x 1  Destruction of lesion - nose x 3, L brow x 1 Complexity: simple   Destruction method: cryotherapy   Informed consent: discussed and consent obtained   Timeout:  patient name, date of birth, surgical site, and procedure verified Lesion destroyed using liquid nitrogen:  Yes   Region frozen until ice ball extended beyond lesion: Yes   Outcome: patient tolerated procedure well with no complications   Post-procedure details: wound care instructions given     Return for 3-42mBotox.  I, SOthelia Pulling RMA, am acting as scribe for DSarina Ser MD . Documentation: I have reviewed the above documentation for accuracy and completeness, and I agree with  the above.  DSarina Ser MD

## 2022-05-18 ENCOUNTER — Encounter: Payer: Self-pay | Admitting: Dermatology

## 2022-08-05 ENCOUNTER — Ambulatory Visit: Payer: Medicare PPO | Admitting: Podiatry

## 2022-08-05 ENCOUNTER — Encounter: Payer: Self-pay | Admitting: Podiatry

## 2022-08-05 DIAGNOSIS — L603 Nail dystrophy: Secondary | ICD-10-CM | POA: Diagnosis not present

## 2022-08-05 NOTE — Progress Notes (Signed)
She presents today for a chief concern of a discolored hallux left foot.  She states that we took samples of the nails before but were unsure which toe it was she states the nail started to get thicker here on this the lateral margin as she points to the hallux left lateral margin.  She denies changes in her past medical history medications allergies surgery social history.  I reviewed her meds past medical history medications.  Objective: Vital signs stable alert oriented x3 I reviewed her past medical history.  Pulses are palpable neurologic sensorium is intact Deetjen reflexes are intact muscle strength is normal and symmetrical.  Hallux nail plate is flat it does appear to be slightly discolored almost like a subungual hematoma but less so.  She does have lifting and thickening of the lateral nail fold of the hallux left.  Assessment nail dystrophy hallux left.  Plan: Samples of the skin and nail were taken today for pathologic evaluation follow-up with her in 1 month

## 2022-08-10 ENCOUNTER — Encounter: Payer: Self-pay | Admitting: Podiatry

## 2022-08-10 ENCOUNTER — Ambulatory Visit: Payer: Medicare PPO | Admitting: Podiatry

## 2022-08-10 DIAGNOSIS — L03032 Cellulitis of left toe: Secondary | ICD-10-CM

## 2022-08-10 MED ORDER — CEPHALEXIN 500 MG PO CAPS: 500.0000 mg | ORAL_CAPSULE | Freq: Three times a day (TID) | ORAL | 0 refills | Status: DC

## 2022-08-10 MED ORDER — AMOXICILLIN 500 MG PO CAPS: 2000.0000 mg | ORAL_CAPSULE | Freq: Once | ORAL | 0 refills | Status: AC

## 2022-08-11 NOTE — Progress Notes (Signed)
  Subjective:  Patient ID: Kiara Donovan, female    DOB: 1951-03-18,  MRN: [id number]  Chief Complaint  Patient presents with   Nail Problem    "I was here last Wednesday.  Dr. Milinda Pointer clipped a piece of my nail Hallux left).  It's been red and swollen.  I started taking Amoxicillin.  I took the last two yesterday.  It's helped.  I'm afraid to let it go."    71 y.o. female presents with the above complaint. History confirmed with patient.  She took all the amoxicillin she normally takes for dental prophylaxis, has had some improvement but has not fully resolved  Objective:  Physical Exam: warm, good capillary refill, no trophic changes or ulcerative lesions, normal DP and PT pulses, normal sensory exam, and erythema around proximal nail fold, no purulent drainage no cellulitis, edema improving.  Assessment:   1. Paronychia of toe of left foot      Plan:  Patient was evaluated and treated and all questions answered.  Appears to have developed a minor paronychia following nail biopsy.  Discussed soaking the toe and I recommended continued antibiotics for an additional 5 days.  Keflex Rx sent to pharmacy, I also refilled her amoxicillin in the event that she requires this for further dental procedures for prophylaxis and she took all of the one she currently has.  Expect this to resolve fully if its not better in 1 week she should let us know for reevaluation  Return if symptoms worsen or fail to improve.

## 2022-08-12 ENCOUNTER — Encounter: Payer: Self-pay | Admitting: Podiatry

## 2022-08-15 ENCOUNTER — Other Ambulatory Visit: Payer: Self-pay | Admitting: Podiatry

## 2022-08-15 MED ORDER — CEPHALEXIN 500 MG PO CAPS
500.0000 mg | ORAL_CAPSULE | Freq: Three times a day (TID) | ORAL | 0 refills | Status: DC
Start: 2022-08-15 — End: 2022-08-19

## 2022-08-17 ENCOUNTER — Telehealth: Payer: Self-pay | Admitting: Podiatry

## 2022-08-17 NOTE — Telephone Encounter (Signed)
She should continue to take antibiotics until follow-up and she can do epsom salt soaks and keep neosporin and bandaid on the toe until then. Her cultures did show fungus. Antibiotics will not treat this and Dr. Milinda Pointer will discuss treatment options for the fungus with her on Wednesday.

## 2022-08-17 NOTE — Telephone Encounter (Signed)
Patient called  - her great toe is infected and its not getting better with antibiotics.  She spoke with on call doctor over the weekend, she is very concerned about it spreading. I have her scheduled for Wednesday with Dr Milinda Pointer in [place]  She said no one called her about the culture she had done that says she has a fungal infection, will normal antibiotics work on fungal?  Please call

## 2022-08-19 ENCOUNTER — Encounter: Payer: Self-pay | Admitting: Podiatry

## 2022-08-19 ENCOUNTER — Ambulatory Visit: Payer: Medicare PPO | Admitting: Podiatry

## 2022-08-19 DIAGNOSIS — L603 Nail dystrophy: Secondary | ICD-10-CM

## 2022-08-19 DIAGNOSIS — L03032 Cellulitis of left toe: Secondary | ICD-10-CM | POA: Diagnosis not present

## 2022-08-19 MED ORDER — CEPHALEXIN 500 MG PO CAPS: 500.0000 mg | ORAL_CAPSULE | Freq: Three times a day (TID) | ORAL | 0 refills | Status: DC

## 2022-08-19 MED ORDER — TERBINAFINE HCL 250 MG PO TABS
250.0000 mg | ORAL_TABLET | Freq: Every day | ORAL | 0 refills | Status: DC
Start: 2022-08-19 — End: 2022-09-28

## 2022-08-19 NOTE — Progress Notes (Signed)
She presents today for follow-up of her biopsy site of her left hallux.  She has been on antibiotics now for 9 days or so and is just starting to improve a bit.  She states that is still sore she has been soaking Epsom salts and warm water she states that is still red and tight.  Objective: Vital signs are stable alert oriented x3.  There is mild erythema to the hallux.  There is no purulence no malodor.  Pathology does demonstrate saprophytic fungus.  Assessment: Most likely she is got a combined bacterial fungal infection to this toe.  Plan: Discussed etiology pathology conservative surgical therapies we will go ahead and start her on Lamisil follow-up with her in 30 days for blood work.  Also going to continue her Keflex 500 mg 1 p.o. 3 times daily.

## 2022-09-01 ENCOUNTER — Ambulatory Visit (INDEPENDENT_AMBULATORY_CARE_PROVIDER_SITE_OTHER): Payer: Self-pay | Admitting: Dermatology

## 2022-09-01 DIAGNOSIS — L988 Other specified disorders of the skin and subcutaneous tissue: Secondary | ICD-10-CM

## 2022-09-01 NOTE — Patient Instructions (Signed)
Due to recent changes in healthcare laws, you may see results of your pathology and/or laboratory studies on MyChart before the doctors have had a chance to review them. We understand that in some cases there may be results that are confusing or concerning to you. Please understand that not all results are received at the same time and often the doctors may need to interpret multiple results in order to provide you with the best plan of care or course of treatment. Therefore, we ask that you please give us 2 business days to thoroughly review all your results before contacting the office for clarification. Should we see a critical lab result, you will be contacted sooner.   If You Need Anything After Your Visit  If you have any questions or concerns for your doctor, please call our main line at [phone] and press option 4 to reach your doctor's medical assistant. If no one answers, please leave a voicemail as directed and we will return your call as soon as possible. Messages left after 4 pm will be answered the following business day.   You may also send us a message via MyChart. We typically respond to MyChart messages within 1-2 business days.  For prescription refills, please ask your pharmacy to contact our office. Our fax number is [phone].  If you have an urgent issue when the clinic is closed that cannot wait until the next business day, you can page your doctor at the number below.    Please note that while we do our best to be available for urgent issues outside of office hours, we are not available 24/7.   If you have an urgent issue and are unable to reach us, you may choose to seek medical care at your doctor's office, retail clinic, urgent care center, or emergency room.  If you have a medical emergency, please immediately call 911 or go to the emergency department.  Pager Numbers  - Dr. Kowalski: [phone]  - Dr. Moye: [phone]  - Dr. Stewart:  [phone]  In the event of inclement weather, please call our main line at [phone] for an update on the status of any delays or closures.  Dermatology Medication Tips: Please keep the boxes that topical medications come in in order to help keep track of the instructions about where and how to use these. Pharmacies typically print the medication instructions only on the boxes and not directly on the medication tubes.   If your medication is too expensive, please contact our office at [phone] option 4 or send us a message through MyChart.   We are unable to tell what your co-pay for medications will be in advance as this is different depending on your insurance coverage. However, we may be able to find a substitute medication at lower cost or fill out paperwork to get insurance to cover a needed medication.   If a prior authorization is required to get your medication covered by your insurance company, please allow us 1-2 business days to complete this process.  Drug prices often vary depending on where the prescription is filled and some pharmacies may offer cheaper prices.  The website www.goodrx.com contains coupons for medications through different pharmacies. The prices here do not account for what the cost may be with help from insurance (it may be cheaper with your insurance), but the website can give you the price if you did not use any insurance.  - You can print the associated coupon and take it with   your prescription to the pharmacy.  - You may also stop by our office during regular business hours and pick up a GoodRx coupon card.  - If you need your prescription sent electronically to a different pharmacy, notify our office through [facility] MyChart or by phone at [phone] option 4.     Si Usted Necesita Algo Despus de Su Visita  Tambin puede enviarnos un mensaje a travs de MyChart. Por lo general respondemos a los mensajes de MyChart en el transcurso de 1 a 2  das hbiles.  Para renovar recetas, por favor pida a su farmacia que se ponga en contacto con nuestra oficina. Nuestro nmero de fax es el [phone].  Si tiene un asunto urgente cuando la clnica est cerrada y que no puede esperar hasta el siguiente da hbil, puede llamar/localizar a su doctor(a) al nmero que aparece a continuacin.   Por favor, tenga en cuenta que aunque hacemos todo lo posible para estar disponibles para asuntos urgentes fuera del horario de oficina, no estamos disponibles las 24 horas del da, los 7 das de la semana.   Si tiene un problema urgente y no puede comunicarse con nosotros, puede optar por buscar atencin mdica  en el consultorio de su doctor(a), en una clnica privada, en un centro de atencin urgente o en una sala de emergencias.  Si tiene una emergencia mdica, por favor llame inmediatamente al 911 o vaya a la sala de emergencias.  Nmeros de bper  - Dr. Kowalski: [phone]  - Dra. Moye: [phone]  - Dra. Stewart: [phone]  En caso de inclemencias del tiempo, por favor llame a nuestra lnea principal al [phone] para una actualizacin sobre el estado de cualquier retraso o cierre.  Consejos para la medicacin en dermatologa: Por favor, guarde las cajas en las que vienen los medicamentos de uso tpico para ayudarle a seguir las instrucciones sobre dnde y cmo usarlos. Las farmacias generalmente imprimen las instrucciones del medicamento slo en las cajas y no directamente en los tubos del medicamento.   Si su medicamento es muy caro, por favor, pngase en contacto con nuestra oficina llamando al [phone] y presione la opcin 4 o envenos un mensaje a travs de MyChart.   No podemos decirle cul ser su copago por los medicamentos por adelantado ya que esto es diferente dependiendo de la cobertura de su seguro. Sin embargo, es posible que podamos encontrar un medicamento sustituto a menor costo o llenar un formulario para que el  seguro cubra el medicamento que se considera necesario.   Si se requiere una autorizacin previa para que su compaa de seguros cubra su medicamento, por favor permtanos de 1 a 2 das hbiles para completar este proceso.  Los precios de los medicamentos varan con frecuencia dependiendo del lugar de dnde se surte la receta y alguna farmacias pueden ofrecer precios ms baratos.  El sitio web www.goodrx.com tiene cupones para medicamentos de diferentes farmacias. Los precios aqu no tienen en cuenta lo que podra costar con la ayuda del seguro (puede ser ms barato con su seguro), pero el sitio web puede darle el precio si no utiliz ningn seguro.  - Puede imprimir el cupn correspondiente y llevarlo con su receta a la farmacia.  - Tambin puede pasar por nuestra oficina durante el horario de atencin regular y recoger una tarjeta de cupones de GoodRx.  - Si necesita que su receta se enve electrnicamente a una farmacia diferente, informe a nuestra oficina a travs de MyChart de [place]   o por telfono llamando al [phone] y presione la opcin 4.  

## 2022-09-01 NOTE — Progress Notes (Signed)
   Follow-Up Visit   Subjective  Kiara Donovan is a 71 y.o. female who presents for the following: Facial Elastosis (Patient is here today for Botox injections).  The following portions of the chart were reviewed this encounter and updated as appropriate:   Tobacco  Allergies  Meds  Problems  Med Hx  Surg Hx  Fam Hx     Review of Systems:  No other skin or systemic complaints except as noted in HPI or Assessment and Plan.  Objective  Well appearing patient in no apparent distress; mood and affect are within normal limits.  A focused examination was performed including the face. Relevant physical exam findings are noted in the Assessment and Plan.  Face Rhytides and volume loss.        Assessment & Plan  Elastosis of skin Face Botox Injection - Face Location: See attached image  Informed consent: Discussed risks (infection, pain, bleeding, bruising, swelling, allergic reaction, paralysis of nearby muscles, eyelid droop, double vision, neck weakness, difficulty breathing, headache, undesirable cosmetic result, and need for additional treatment) and benefits of the procedure, as well as the alternatives.  Informed consent was obtained.  Preparation: The area was cleansed with alcohol.  Procedure Details:  Botox was injected into the dermis with a 30-gauge needle. Pressure applied to any bleeding. Ice packs offered for swelling.  Lot Number:  [id number] Expiration:  10/2024  Total Units Injected:  50  Plan: Patient was instructed to remain upright for 4 hours. Patient was instructed to avoid massaging the face and avoid vigorous exercise for the rest of the day. Tylenol may be used for headache.  Allow 2 weeks before returning to clinic for additional dosing as needed. Patient will call for any problems.  Return in about 4 months (around 01/01/2023) for Botox injections and treat cosmetic SK's and AK's.  Luther Redo, CMA, am acting as scribe for Sarina Ser,  MD . Documentation: I have reviewed the above documentation for accuracy and completeness, and I agree with the above.  Sarina Ser, MD

## 2022-09-04 ENCOUNTER — Encounter: Payer: Self-pay | Admitting: Dermatology

## 2022-09-09 ENCOUNTER — Ambulatory Visit (INDEPENDENT_AMBULATORY_CARE_PROVIDER_SITE_OTHER): Payer: Medicare PPO | Admitting: Podiatry

## 2022-09-09 DIAGNOSIS — L603 Nail dystrophy: Secondary | ICD-10-CM | POA: Diagnosis not present

## 2022-09-09 DIAGNOSIS — L03032 Cellulitis of left toe: Secondary | ICD-10-CM

## 2022-09-09 MED ORDER — TERBINAFINE HCL 250 MG PO TABS: 250.0000 mg | ORAL_TABLET | Freq: Every day | ORAL | 0 refills | Status: DC

## 2022-09-09 MED ORDER — CEPHALEXIN 500 MG PO CAPS: 500.0000 mg | ORAL_CAPSULE | Freq: Three times a day (TID) | ORAL | 0 refills | Status: DC

## 2022-09-09 NOTE — Progress Notes (Signed)
She presents today states that she is concerned about her great toe left she states that there is still little bit of redness present she went to [facility] because she was concerned about sepsis and cellulitis and stiffness in her toes.  They basically told her that she needed to follow back up with [place].  She took her medication and had no problems taking it.  Denies fever chills nausea vomiting muscle aches pains calf pain back pain chest pain shortness of breath.  She does state that the only thing she found different was that her toes were stiff on her left foot.  Objective: Vital signs are stable alert oriented x3.  She has some lightening of the nail plate along the tibial border hallux left.  There is very mild erythema along the proximal nail fold.  No purulence no malodor no signs of infection.  Assessment: Cannot rule out paronychia and obviously onychomycosis.  Plan: We will send her for blood work today should that come back abnormal we will notify her immediately otherwise were going to go ahead and start her on her next dose of medication.  90 tablets of Lamisil 1 p.o. daily.  Questions or concerns she will notify us immediately.  I am also going to start her on Keflex 500 mg 1 p.o. 3 times daily for the next 10 days and I will follow-up with her in 1 month just to evaluate the toe and then schedule her out for another 3 months after that.

## 2022-09-10 LAB — COMPREHENSIVE METABOLIC PANEL
ALT: 21 IU/L (ref 0–32)
AST: 27 IU/L (ref 0–40)
Albumin/Globulin Ratio: 1.5 (ref 1.2–2.2)
Albumin: 4.3 g/dL (ref 3.8–4.8)
Alkaline Phosphatase: 66 IU/L (ref 44–121)
BUN/Creatinine Ratio: 15 (ref 12–28)
BUN: 11 mg/dL (ref 8–27)
Bilirubin Total: 0.4 mg/dL (ref 0.0–1.2)
CO2: 25 mmol/L (ref 20–29)
Calcium: 8.9 mg/dL (ref 8.7–10.3)
Chloride: 101 mmol/L (ref 96–106)
Creatinine, Ser: 0.72 mg/dL (ref 0.57–1.00)
Globulin, Total: 2.9 g/dL (ref 1.5–4.5)
Glucose: 80 mg/dL (ref 70–99)
Potassium: 4.6 mmol/L (ref 3.5–5.2)
Sodium: 139 mmol/L (ref 134–144)
Total Protein: 7.2 g/dL (ref 6.0–8.5)
eGFR: 89 mL/min/{1.73_m2} (ref >59)

## 2022-09-10 LAB — CBC WITH DIFFERENTIAL/PLATELET
Basophils Absolute: 0.1 10*3/uL (ref 0.0–0.2)
Basos: 1 %
EOS (ABSOLUTE): 0.3 10*3/uL (ref 0.0–0.4)
Eos: 5 %
Hematocrit: 40 % (ref 34.0–46.6)
Hemoglobin: 12.9 g/dL (ref 11.1–15.9)
Immature Grans (Abs): 0 10*3/uL (ref 0.0–0.1)
Immature Granulocytes: 0 %
Lymphocytes Absolute: 1.7 10*3/uL (ref 0.7–3.1)
Lymphs: 25 %
MCH: 30.4 pg (ref 26.6–33.0)
MCHC: 32.3 g/dL (ref 31.5–35.7)
MCV: 94 fL (ref 79–97)
Monocytes Absolute: 0.6 10*3/uL (ref 0.1–0.9)
Monocytes: 9 %
Neutrophils Absolute: 4.2 10*3/uL (ref 1.4–7.0)
Neutrophils: 60 %
Platelets: 221 10*3/uL (ref 150–450)
RBC: 4.24 x10E6/uL (ref 3.77–5.28)
RDW: 12.6 % (ref 11.7–15.4)
WBC: 6.9 10*3/uL (ref 3.4–10.8)

## 2022-09-15 NOTE — Progress Notes (Signed)
Spoke with patient giving blood work results, recommendations to continue medication

## 2022-09-19 ENCOUNTER — Other Ambulatory Visit (INDEPENDENT_AMBULATORY_CARE_PROVIDER_SITE_OTHER): Payer: Medicare PPO | Admitting: Podiatry

## 2022-09-19 DIAGNOSIS — L03032 Cellulitis of left toe: Secondary | ICD-10-CM

## 2022-09-19 MED ORDER — CEPHALEXIN 500 MG PO CAPS: 500.0000 mg | ORAL_CAPSULE | Freq: Three times a day (TID) | ORAL | 0 refills | Status: AC

## 2022-09-19 NOTE — Progress Notes (Signed)
Pt called states she only has one dose left of cephalexin which she has been on for past 10 days for paronychia of great toe on left foot, per patient redness in toe has decreased and is improving since begin on abx for past 10 days. Concerned that the redness / infection will worsen if she stops taking them now. Discussed I will send her an additional 5 days worth of cephalexin 500 mg TID. She stated appreciation for additional abx being sent. Discussed if concerned for worsening redness swelling drainage or N/V/F/C into next week should call the office for apt sooner than currently scheduled follow up.

## 2022-09-28 ENCOUNTER — Encounter: Payer: Self-pay | Admitting: Podiatry

## 2022-09-28 ENCOUNTER — Ambulatory Visit: Payer: Medicare PPO | Admitting: Podiatry

## 2022-09-28 DIAGNOSIS — L03032 Cellulitis of left toe: Secondary | ICD-10-CM

## 2022-09-28 MED ORDER — CEPHALEXIN 500 MG PO CAPS: 500.0000 mg | ORAL_CAPSULE | Freq: Three times a day (TID) | ORAL | 0 refills | Status: DC

## 2022-09-28 NOTE — Progress Notes (Signed)
She presents today for follow-up of her hallux left for follow-up of paronychia states that every time I complete the antibiotics it comes back is still red and tender around the bottom of the nail.  But it seems to be doing better at this time that it has been previously.  Objective: Vital signs are stable alert oriented x3.  Hallux nail does demonstrate some erythema to the proximal tibial nail fold which is tender on palpation no purulence no malodor is expressed.  Assessment: Paronychia.  Plan: I recommended to her that we go ahead and perform a I&D to remove that border of the nail so that this could go ahead and heal up she currently does not want to do that she would like to go ahead and try another round of antibiotics and we will do so and she will continue to soak.  Keflex 500 mg tablets 1 p.o. 3 times daily x10 days follow-up with me November 1

## 2022-09-30 ENCOUNTER — Encounter: Payer: Self-pay | Admitting: Podiatry

## 2022-09-30 ENCOUNTER — Ambulatory Visit: Payer: Medicare PPO | Admitting: Podiatry

## 2022-09-30 DIAGNOSIS — L03032 Cellulitis of left toe: Secondary | ICD-10-CM

## 2022-09-30 DIAGNOSIS — L603 Nail dystrophy: Secondary | ICD-10-CM

## 2022-09-30 MED ORDER — CIPROFLOXACIN-DEXAMETHASONE 0.3-0.1 % OT SUSP: OTIC | 2 refills | Status: DC

## 2022-09-30 NOTE — Patient Instructions (Signed)

## 2022-09-30 NOTE — Progress Notes (Signed)
She presents today for follow-up of her chronic paronychia hallux left.  States that she would just have to like to have the toenail removed.  Objective: Vital signs stable oriented x3 surrounding tissue does demonstrate mild to moderate erythema no purulence no malodor.  Assessment: Chronic paronychia hallux left.  Plan: At this point I recommended removing the entire nail plate to clean the area out.  There was noticed that there was some serosanguineous fluid beneath the nail.  No purulence.  She will continue to take her oral antibiotic will start soaking this Epsom salts warm water and follow-up with me in 2 to 3 weeks

## 2022-10-05 ENCOUNTER — Telehealth: Payer: Self-pay | Admitting: Podiatry

## 2022-10-05 NOTE — Telephone Encounter (Signed)
Pt called and stated the toe that you worked on last week is still red, puffy and tender and the antibiotics are causing nausea. She has been on antibiotics since 8.21 with this toe. And wanted to make sure this was normal. I talked with her this morning and transferred her to the triage nurse. She called back again after lunch and I scheduled her to see you tomorrow in [place] office and pt is aware of office location.

## 2022-10-06 ENCOUNTER — Encounter: Payer: Self-pay | Admitting: Podiatry

## 2022-10-06 ENCOUNTER — Ambulatory Visit: Payer: Medicare PPO | Admitting: Podiatry

## 2022-10-06 DIAGNOSIS — Z9889 Other specified postprocedural states: Secondary | ICD-10-CM

## 2022-10-06 DIAGNOSIS — L03032 Cellulitis of left toe: Secondary | ICD-10-CM

## 2022-10-06 MED ORDER — MUPIROCIN 2 % EX OINT: 1.0000 | TOPICAL_OINTMENT | Freq: Two times a day (BID) | CUTANEOUS | 0 refills | Status: DC

## 2022-10-06 NOTE — Progress Notes (Signed)
She presents today for follow-up of removal hallux nail left.  States that just wanted him to look at it and to check it before I go to this party this weekend cannot take anymore oral antibiotics my stomach is just upset with it.  Objective: Vital signs stable alert oriented x3.  Hallux left demonstrates well-healing nail avulsion on the bed appears to be healing very nicely the proximal nail fold does demonstrate some mild erythema consistent with nail avulsion but she had some of this prior as well.  I do not see that this is cellulitic.  Assessment: Postinflammatory hyperpigmentation well-healing nail avulsion.  Plan: Just to be on the safe side I am going to allow her to utilize Bactroban ointment just a small amount around the proximal nail margin.  She will continue to soak Epsom salts and warm water covered there is a day leave open at bedtime.  Follow-up with me in a couple weeks

## 2022-10-10 ENCOUNTER — Emergency Department: Payer: Medicare PPO

## 2022-10-10 ENCOUNTER — Emergency Department
Admission: EM | Admit: 2022-10-10 | Discharge: 2022-10-10 | Disposition: A | Discharge status: Home / Self Care | Payer: Medicare PPO | Attending: Emergency Medicine | Admitting: Emergency Medicine

## 2022-10-10 ENCOUNTER — Other Ambulatory Visit: Payer: Self-pay

## 2022-10-10 DIAGNOSIS — Z1152 Encounter for screening for COVID-19: Secondary | ICD-10-CM | POA: Diagnosis not present

## 2022-10-10 DIAGNOSIS — R059 Cough, unspecified: Secondary | ICD-10-CM | POA: Diagnosis present

## 2022-10-10 DIAGNOSIS — J069 Acute upper respiratory infection, unspecified: Secondary | ICD-10-CM | POA: Insufficient documentation

## 2022-10-10 DIAGNOSIS — R55 Syncope and collapse: Secondary | ICD-10-CM | POA: Insufficient documentation

## 2022-10-10 DIAGNOSIS — E860 Dehydration: Secondary | ICD-10-CM | POA: Diagnosis not present

## 2022-10-10 LAB — TROPONIN I (HIGH SENSITIVITY)
Troponin I (High Sensitivity): 4 ng/L (ref <18)
Troponin I (High Sensitivity): 5 ng/L (ref <18)

## 2022-10-10 LAB — CBC WITH DIFFERENTIAL/PLATELET
Abs Immature Granulocytes: 0.02 10*3/uL (ref 0.00–0.07)
Basophils Absolute: 0 10*3/uL (ref 0.0–0.1)
Basophils Relative: 0 %
Eosinophils Absolute: 0.1 10*3/uL (ref 0.0–0.5)
Eosinophils Relative: 2 %
HCT: 38.7 % (ref 36.0–46.0)
Hemoglobin: 12.9 g/dL (ref 12.0–15.0)
Immature Granulocytes: 0 %
Lymphocytes Relative: 22 %
Lymphs Abs: 1.5 10*3/uL (ref 0.7–4.0)
MCH: 30.4 pg (ref 26.0–34.0)
MCHC: 33.3 g/dL (ref 30.0–36.0)
MCV: 91.3 fL (ref 80.0–100.0)
Monocytes Absolute: 0.5 10*3/uL (ref 0.1–1.0)
Monocytes Relative: 8 %
Neutro Abs: 4.5 10*3/uL (ref 1.7–7.7)
Neutrophils Relative %: 68 %
Platelets: 228 10*3/uL (ref 150–400)
RBC: 4.24 MIL/uL (ref 3.87–5.11)
RDW: 12.9 % (ref 11.5–15.5)
WBC: 6.7 10*3/uL (ref 4.0–10.5)
nRBC: 0 % (ref 0.0–0.2)

## 2022-10-10 LAB — COMPREHENSIVE METABOLIC PANEL
ALT: 27 U/L (ref 0–44)
AST: 29 U/L (ref 15–41)
Albumin: 3.7 g/dL (ref 3.5–5.0)
Alkaline Phosphatase: 63 U/L (ref 38–126)
Anion gap: 6 (ref 5–15)
BUN: 14 mg/dL (ref 8–23)
CO2: 27 mmol/L (ref 22–32)
Calcium: 8.8 mg/dL — ABNORMAL LOW (ref 8.9–10.3)
Chloride: 95 mmol/L — ABNORMAL LOW (ref 98–111)
Creatinine, Ser: 0.73 mg/dL (ref 0.44–1.00)
GFR, Estimated: >60 mL/min (ref >60)
Glucose, Bld: 144 mg/dL — ABNORMAL HIGH (ref 70–99)
Potassium: 3.9 mmol/L (ref 3.5–5.1)
Sodium: 128 mmol/L — ABNORMAL LOW (ref 135–145)
Total Bilirubin: 0.7 mg/dL (ref 0.3–1.2)
Total Protein: 7.5 g/dL (ref 6.5–8.1)

## 2022-10-10 LAB — URINALYSIS, ROUTINE W REFLEX MICROSCOPIC
Bacteria, UA: NONE SEEN
Bilirubin Urine: NEGATIVE
Glucose, UA: NEGATIVE mg/dL
Hgb urine dipstick: NEGATIVE
Ketones, ur: NEGATIVE mg/dL
Leukocytes,Ua: NEGATIVE
Nitrite: NEGATIVE
Protein, ur: 30 mg/dL — AB
Specific Gravity, Urine: 1.025 (ref 1.005–1.030)
pH: 5 (ref 5.0–8.0)

## 2022-10-10 LAB — RESP PANEL BY RT-PCR (FLU A&B, COVID) ARPGX2
Influenza A by PCR: NEGATIVE
Influenza B by PCR: NEGATIVE
SARS Coronavirus 2 by RT PCR: NEGATIVE

## 2022-10-10 LAB — LACTIC ACID, PLASMA: Lactic Acid, Venous: 1.7 mmol/L (ref 0.5–1.9)

## 2022-10-10 LAB — PROCALCITONIN: Procalcitonin: <0.1 ng/mL

## 2022-10-10 LAB — LIPASE, BLOOD: Lipase: 31 U/L (ref 11–51)

## 2022-10-10 MED ORDER — LACTATED RINGERS IV BOLUS
1000.0000 mL | Freq: Once | INTRAVENOUS | Status: AC
  Administered 2022-10-10: 1000 mL via INTRAVENOUS

## 2022-10-10 MED ORDER — SODIUM CHLORIDE 0.9 % IV SOLN
2.0000 g | Freq: Once | INTRAVENOUS | Status: AC
  Administered 2022-10-10: 2 g via INTRAVENOUS
  Filled 2022-10-10: qty 20

## 2022-10-10 MED ORDER — LACTATED RINGERS IV BOLUS
500.0000 mL | Freq: Once | INTRAVENOUS | Status: AC
  Administered 2022-10-10: 500 mL via INTRAVENOUS

## 2022-10-10 MED ORDER — ONDANSETRON 4 MG PO TBDP: 4.0000 mg | ORAL_TABLET | Freq: Three times a day (TID) | ORAL | 0 refills | Status: DC | PRN

## 2022-10-10 MED ORDER — DOXYCYCLINE HYCLATE 100 MG PO CAPS: 100.0000 mg | ORAL_CAPSULE | Freq: Two times a day (BID) | ORAL | 0 refills | Status: AC

## 2022-10-10 MED ORDER — IOHEXOL 350 MG/ML SOLN
75.0000 mL | Freq: Once | INTRAVENOUS | Status: AC | PRN
  Administered 2022-10-10: 75 mL via INTRAVENOUS

## 2022-10-10 NOTE — ED Provider Triage Note (Signed)
Emergency Medicine Provider Triage Evaluation Note  Kiara Donovan , a 71 y.o. female  was evaluated in triage.  Pt complains of syncope. Came from [facility]. Became sweaty and pale and she was slumping. [facility] staff do not think she lost consciousness. Patient has had cough, runny nose, no fever. She has been fatigued since last Saturday. Has been on abx for a toe infection, completed Wednesday. No vomiting or diarrhea.   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 Gen:   Ambulance person, hypotensive Resp:  Normal effort  MSK:   Moves extremities without difficulty  Other:    Medical Decision Making  Medically screening exam initiated at 12:29 PM.  Appropriate orders placed.  ZAREEN JAMISON was informed that the remainder of the evaluation will be completed by another provider, this initial triage assessment does not replace that evaluation, and the importance of remaining in the ED until their evaluation is complete.      Marquette Old, PA-C 10/10/22 1243

## 2022-10-10 NOTE — Discharge Instructions (Addendum)
For your infection:  We are going to start Doxycycline for possible Pneumonia or sinus infection.  STOP the [place]. You can continue the MUPIROCIN ointment to the toe but do not need to complete the Keflex.  Drink AT LEAST 6-8 glasses of water daily for the next several days  Take a PROBIOTIC and take the antibiotics WITH food  Return to the ER as needed  Follow-up with your primary

## 2022-10-10 NOTE — ED Triage Notes (Signed)
Pt brought in wheelchair from [facility], was being seen for ongoing URI symptoms and fatigue since 1 week. Recently treated with several abx for URI. Pt was being seen and suddenly became lethargic, pale and diaphoretic.  CBG is 159. EKG performed, NSR on ekg.   Pt is pale, eyes closed, diaphoretic, BP is 84/58. Moved to stretcher and treatment room. 20# to R AC, 1 set cultures drawn, regular labs and blue top, lactic.

## 2022-10-10 NOTE — ED Provider Notes (Signed)
[facility] Provider Note    Event Date/Time   First MD Initiated Contact with Patient 10/10/22 1259     (approximate)   History   Altered Mental Status and pale   HPI  Kiara Donovan is a 71 y.o. female  here with generalized weakness, cough, near syncopal episode. Pt states she has felt generally unwell for the last week with cough, nasal congestion, and rhinorrhea. She's had productive cough and some SOB with exertion. This occurs in the setting of recent skin infection of her toe for which she is taking keflex. No known sick contacts. No abdominal pain. No urinary sx. She went to UC today and while there, began to feel acutely lightheaded, hot, flushed, and like she was going to pass out.  She had no CP during this episode.      Physical Exam   Triage Vital Signs: ED Triage Vitals  Enc Vitals Group     BP 10/10/22 1234 (!) 84/58     Pulse Rate 10/10/22 1234 71     Resp 10/10/22 1234 20     Temp 10/10/22 1234 97.8 F (36.6 C)     Temp Source 10/10/22 1234 Oral     SpO2 10/10/22 1234 95 %     Weight 10/10/22 1235 130 lb (59 kg)     Height 10/10/22 1235 '5\' 6"'$  (1.676 m)     Head Circumference --      Peak Flow --      Pain Score 10/10/22 1235 0     Pain Loc --      Pain Edu? --      Excl. in [place]? --     Most recent vital signs: Vitals:   10/10/22 1714 10/10/22 1730  BP:  139/73  Pulse:  78  Resp:  (!) 21  Temp: 98.1 F (36.7 C)   SpO2:  99%     General: Awake, no distress.  CV:  Good peripheral perfusion. RRR. No murmurs. Resp:  Normal effort. Lungs with focal wheezing/rhonchi in RML. Abd:  No distention. No tenderness. No guarding or rebound. Other:  AOx4. CN intact. Strength 5/5 bilateral UE and LE. Normal sensation to light touch.   ED Results / Procedures / Treatments   Labs (all labs ordered are listed, but only abnormal results are displayed) Labs Reviewed  URINALYSIS, ROUTINE W REFLEX MICROSCOPIC - Abnormal;  Notable for the following components:      Result Value   Color, Urine YELLOW (*)    APPearance HAZY (*)    Protein, ur 30 (*)    All other components within normal limits  COMPREHENSIVE METABOLIC PANEL - Abnormal; Notable for the following components:   Sodium 128 (*)    Chloride 95 (*)    Glucose, Bld 144 (*)    Calcium 8.8 (*)    All other components within normal limits  RESP PANEL BY RT-PCR (FLU A&B, COVID) ARPGX2  CULTURE, BLOOD (ROUTINE X 2)  CULTURE, BLOOD (ROUTINE X 2)  LACTIC ACID, PLASMA  LIPASE, BLOOD  CBC WITH DIFFERENTIAL/PLATELET  PROCALCITONIN  LACTIC ACID, PLASMA  TROPONIN I (HIGH SENSITIVITY)  TROPONIN I (HIGH SENSITIVITY)     EKG Normal sinus rhythm, VR 77. PR 170, QRS 86, QTc 484. No acute ST elevations or depressions. No ischemia or infarct.   RADIOLOGY CXR: Clear CT Angio :No acute findings   I also independently reviewed and agree with radiologist interpretations.   PROCEDURES:  Critical Care performed: No  MEDICATIONS ORDERED IN ED: Medications  lactated ringers bolus 1,000 mL (0 mLs Intravenous Stopped 10/10/22 1803)  lactated ringers bolus 500 mL (0 mLs Intravenous Stopped 10/10/22 1803)  iohexol (OMNIPAQUE) 350 MG/ML injection 75 mL (75 mLs Intravenous Contrast Given 10/10/22 1454)  cefTRIAXone (ROCEPHIN) 2 g in sodium chloride 0.9 % 100 mL IVPB (0 g Intravenous Stopped 10/10/22 1803)     IMPRESSION / MDM / ASSESSMENT AND PLAN / ED COURSE  I reviewed the triage vital signs and the nursing notes.                               The patient is on the cardiac monitor to evaluate for evidence of arrhythmia and/or significant heart rate changes.   Ddx:  Differential includes the following, with pertinent life- or limb-threatening emergencies considered:  Generalized weakness in setting of PNA with presyncope from orthostasis/dehydration, anemia, sepsis, electrolyte abnormality, arrhythmia  Patient's presentation is most consistent  with acute presentation with potential threat to life or bodily function.  MDM:  71 yo F with PMHx anxiety, HLD, breast CA here with generalized weakness, near syncopal episode in office. Clinically, suspect possible CAP with dehydration and orthostasis. Pt has focal RML/RLL wheezing/rhonchi with sputum production, chills, nausea, poor appetite. CBC shows no leukocytosis or anemia. CMP shows hyponatremia, likely hypovolemic in setting of poor PO intake. EKG nonischemic, trop negative. UA concentrated but otherwise unremarkable.   Pt had prodrome of lightheadedness, dizziness, prior to near syncopal episode which I suspect was from dehydration/orthostasis. No signs to suggest arrhythmia, ischemia, or more emergent pathology. Clinically, pt has sputum production, cough, focal findings, and has been on Keflex so may have falsely reassuring labs. Will broaden to cover for possible CAP.  Otherwise, will give fluids, plan to reassess. Suspect pt has generalized weakness in setting of viral URI/early CAP, likely can be managed as outpt if she feels better with fluids and is tolerating PO.   MEDICATIONS GIVEN IN ED: Medications  lactated ringers bolus 1,000 mL (0 mLs Intravenous Stopped 10/10/22 1803)  lactated ringers bolus 500 mL (0 mLs Intravenous Stopped 10/10/22 1803)  iohexol (OMNIPAQUE) 350 MG/ML injection 75 mL (75 mLs Intravenous Contrast Given 10/10/22 1454)  cefTRIAXone (ROCEPHIN) 2 g in sodium chloride 0.9 % 100 mL IVPB (0 g Intravenous Stopped 10/10/22 1803)     Consults:  None   EMR reviewed      FINAL CLINICAL IMPRESSION(S) / ED DIAGNOSES   Final diagnoses:  Dehydration  Near syncope  Upper respiratory tract infection, unspecified type     Rx / DC Orders   ED Discharge Orders          Ordered    doxycycline (VIBRAMYCIN) 100 MG capsule  2 times daily        10/10/22 1559    ondansetron (ZOFRAN-ODT) 4 MG disintegrating tablet  Every 8 hours PRN        10/10/22 1559              Note:  This document was prepared using Dragon voice recognition software and may include unintentional dictation errors.   Duffy Bruce, MD 10/10/22 [phone]

## 2022-10-15 LAB — CULTURE, BLOOD (ROUTINE X 2)
Culture: NO GROWTH
Special Requests: ADEQUATE

## 2022-10-21 ENCOUNTER — Encounter: Payer: Self-pay | Admitting: Podiatry

## 2022-10-21 ENCOUNTER — Ambulatory Visit: Payer: Medicare PPO | Admitting: Podiatry

## 2022-10-21 DIAGNOSIS — L03032 Cellulitis of left toe: Secondary | ICD-10-CM

## 2022-10-22 NOTE — Progress Notes (Signed)
She presents today for follow-up of her paronychia hallux left.  States that he has his good days and bad days continue to soak it twice daily and apply antibiotic ointment with a Band-Aid.  Objective: Vital signs stable tellurian x3.  Hallux left demonstrates dry scaly toe with a dry nail bed mild erythema surrounding the nailbed but most likely associated with the nail avulsion this looks relatively normal.  There is no purulence no malodor the tissue is dry there is no purulence on palpation.  Assessment: Well-healing nail avulsion status post paronychia  Plan: She will lessen the concentration of salt and decrease her soaking to once a day.  She will no longer cover this she will notify us with questions or concerns.

## 2022-11-23 ENCOUNTER — Ambulatory Visit: Payer: Medicare PPO | Admitting: Podiatry

## 2022-11-23 ENCOUNTER — Encounter: Payer: Self-pay | Admitting: Podiatry

## 2022-11-23 DIAGNOSIS — L03032 Cellulitis of left toe: Secondary | ICD-10-CM

## 2022-11-23 NOTE — Progress Notes (Signed)
She presents today for follow-up of her nail avulsion hallux left.  States that it still seems a little red to me.  Objective: Vital signs are stable alert and oriented x 3.  Pulses are palpable.  There is no erythema edema salines drainage odor to the hallux left there is a small spicule of skin which I debrided for her today.  No purulence no malodor no signs of infection nail appears to be starting to grow proximally.  She has injured the toe twice as she has dropped 2 items on it which resulted in a small bruise along the proximal nail fold.  Assessment well-healing nail avulsion.  Plan: Follow-up with me on an as-needed basis.

## 2023-01-26 ENCOUNTER — Ambulatory Visit: Payer: Medicare PPO | Admitting: Dermatology

## 2023-03-02 ENCOUNTER — Ambulatory Visit: Payer: Medicare PPO | Admitting: Dermatology

## 2023-03-02 VITALS — BP 142/89 | HR 79

## 2023-03-02 DIAGNOSIS — L821 Other seborrheic keratosis: Secondary | ICD-10-CM

## 2023-03-02 DIAGNOSIS — L570 Actinic keratosis: Secondary | ICD-10-CM

## 2023-03-02 DIAGNOSIS — L578 Other skin changes due to chronic exposure to nonionizing radiation: Secondary | ICD-10-CM | POA: Diagnosis not present

## 2023-03-02 DIAGNOSIS — L820 Inflamed seborrheic keratosis: Secondary | ICD-10-CM | POA: Diagnosis not present

## 2023-03-02 DIAGNOSIS — L814 Other melanin hyperpigmentation: Secondary | ICD-10-CM | POA: Diagnosis not present

## 2023-03-02 DIAGNOSIS — L988 Other specified disorders of the skin and subcutaneous tissue: Secondary | ICD-10-CM

## 2023-03-02 NOTE — Patient Instructions (Signed)
Due to recent changes in healthcare laws, you may see results of your pathology and/or laboratory studies on MyChart before the doctors have had a chance to review them. We understand that in some cases there may be results that are confusing or concerning to you. Please understand that not all results are received at the same time and often the doctors may need to interpret multiple results in order to provide you with the best plan of care or course of treatment. Therefore, we ask that you please give us 2 business days to thoroughly review all your results before contacting the office for clarification. Should we see a critical lab result, you will be contacted sooner.   If You Need Anything After Your Visit  If you have any questions or concerns for your doctor, please call our main line at [phone] and press option 4 to reach your doctor's medical assistant. If no one answers, please leave a voicemail as directed and we will return your call as soon as possible. Messages left after 4 pm will be answered the following business day.   You may also send us a message via MyChart. We typically respond to MyChart messages within 1-2 business days.  For prescription refills, please ask your pharmacy to contact our office. Our fax number is [phone].  If you have an urgent issue when the clinic is closed that cannot wait until the next business day, you can page your doctor at the number below.    Please note that while we do our best to be available for urgent issues outside of office hours, we are not available 24/7.   If you have an urgent issue and are unable to reach us, you may choose to seek medical care at your doctor's office, retail clinic, urgent care center, or emergency room.  If you have a medical emergency, please immediately call 911 or go to the emergency department.  Pager Numbers  - Dr. Kowalski: [phone]  - Dr. Moye: [phone]  - Dr. Stewart:  [phone]  In the event of inclement weather, please call our main line at [phone] for an update on the status of any delays or closures.  Dermatology Medication Tips: Please keep the boxes that topical medications come in in order to help keep track of the instructions about where and how to use these. Pharmacies typically print the medication instructions only on the boxes and not directly on the medication tubes.   If your medication is too expensive, please contact our office at [phone] option 4 or send us a message through MyChart.   We are unable to tell what your co-pay for medications will be in advance as this is different depending on your insurance coverage. However, we may be able to find a substitute medication at lower cost or fill out paperwork to get insurance to cover a needed medication.   If a prior authorization is required to get your medication covered by your insurance company, please allow us 1-2 business days to complete this process.  Drug prices often vary depending on where the prescription is filled and some pharmacies may offer cheaper prices.  The website www.goodrx.com contains coupons for medications through different pharmacies. The prices here do not account for what the cost may be with help from insurance (it may be cheaper with your insurance), but the website can give you the price if you did not use any insurance.  - You can print the associated coupon and take it with   your prescription to the pharmacy.  - You may also stop by our office during regular business hours and pick up a GoodRx coupon card.  - If you need your prescription sent electronically to a different pharmacy, notify our office through [facility] MyChart or by phone at [phone] option 4.     Si Usted Necesita Algo Despus de Su Visita  Tambin puede enviarnos un mensaje a travs de MyChart. Por lo general respondemos a los mensajes de MyChart en el transcurso de 1 a 2  das hbiles.  Para renovar recetas, por favor pida a su farmacia que se ponga en contacto con nuestra oficina. Nuestro nmero de fax es el [phone].  Si tiene un asunto urgente cuando la clnica est cerrada y que no puede esperar hasta el siguiente da hbil, puede llamar/localizar a su doctor(a) al nmero que aparece a continuacin.   Por favor, tenga en cuenta que aunque hacemos todo lo posible para estar disponibles para asuntos urgentes fuera del horario de oficina, no estamos disponibles las 24 horas del da, los 7 das de la semana.   Si tiene un problema urgente y no puede comunicarse con nosotros, puede optar por buscar atencin mdica  en el consultorio de su doctor(a), en una clnica privada, en un centro de atencin urgente o en una sala de emergencias.  Si tiene una emergencia mdica, por favor llame inmediatamente al 911 o vaya a la sala de emergencias.  Nmeros de bper  - Dr. Kowalski: [phone]  - Dra. Moye: [phone]  - Dra. Stewart: [phone]  En caso de inclemencias del tiempo, por favor llame a nuestra lnea principal al [phone] para una actualizacin sobre el estado de cualquier retraso o cierre.  Consejos para la medicacin en dermatologa: Por favor, guarde las cajas en las que vienen los medicamentos de uso tpico para ayudarle a seguir las instrucciones sobre dnde y cmo usarlos. Las farmacias generalmente imprimen las instrucciones del medicamento slo en las cajas y no directamente en los tubos del medicamento.   Si su medicamento es muy caro, por favor, pngase en contacto con nuestra oficina llamando al [phone] y presione la opcin 4 o envenos un mensaje a travs de MyChart.   No podemos decirle cul ser su copago por los medicamentos por adelantado ya que esto es diferente dependiendo de la cobertura de su seguro. Sin embargo, es posible que podamos encontrar un medicamento sustituto a menor costo o llenar un formulario para que el  seguro cubra el medicamento que se considera necesario.   Si se requiere una autorizacin previa para que su compaa de seguros cubra su medicamento, por favor permtanos de 1 a 2 das hbiles para completar este proceso.  Los precios de los medicamentos varan con frecuencia dependiendo del lugar de dnde se surte la receta y alguna farmacias pueden ofrecer precios ms baratos.  El sitio web www.goodrx.com tiene cupones para medicamentos de diferentes farmacias. Los precios aqu no tienen en cuenta lo que podra costar con la ayuda del seguro (puede ser ms barato con su seguro), pero el sitio web puede darle el precio si no utiliz ningn seguro.  - Puede imprimir el cupn correspondiente y llevarlo con su receta a la farmacia.  - Tambin puede pasar por nuestra oficina durante el horario de atencin regular y recoger una tarjeta de cupones de GoodRx.  - Si necesita que su receta se enve electrnicamente a una farmacia diferente, informe a nuestra oficina a travs de MyChart de [facility]   o por telfono llamando al [phone] y presione la opcin 4.  

## 2023-03-02 NOTE — Progress Notes (Unsigned)
Follow-Up Visit   Subjective  Kiara Donovan is a 71 y.o. 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 The patient has spots, moles and lesions to be evaluated, some may be new or changing.  The following portions of the chart were reviewed this encounter and updated as appropriate:   Tobacco  Allergies  Meds  Problems  Med Hx  Surg Hx  Fam Hx     Review of Systems:  No other skin or systemic complaints except as noted in HPI or Assessment and Plan.  Objective  Well appearing patient in no apparent distress; mood and affect are within normal limits.  A focused examination was performed including the face. Relevant physical exam findings are noted in the Assessment and Plan.  Face Rhytides and volume loss.   R cheek x 1, L cheek x 1, L forearm x 1 (3) Erythematous stuck-on, waxy papule or plaque  R nose x 2, L nose x 1 (3) Erythematous thin papules/macules with gritty scale.    Assessment & Plan  Elastosis of skin Face  Botox 50 units injected as marked:  - Frown complex 20 units - Crow's feet 10 units each for a total of 20 units  - B/L brow lift 5 units each side for a total of 10 units  Botox Injection - Face Location: See attached image  Informed consent: Discussed risks (infection, pain, bleeding, bruising, swelling, allergic reaction, paralysis of nearby muscles, eyelid droop, double vision, neck weakness, difficulty breathing, headache, undesirable cosmetic result, and need for additional treatment) and benefits of the procedure, as well as the alternatives.  Informed consent was obtained.  Preparation: The area was cleansed with alcohol.  Procedure Details:  Botox was injected into the dermis with a 30-gauge needle. Pressure applied to any bleeding. Ice packs  offered for swelling.  Lot Number:  [id number] Expiration:  02/2025  Total Units Injected:  50   Plan: Patient was instructed to remain upright for 4 hours. Patient was instructed to avoid massaging the face and avoid vigorous exercise for the rest of the day. Tylenol may be used for headache.  Allow 2 weeks before returning to clinic for additional dosing as needed. Patient will call for any problems.   Inflamed seborrheic keratosis (3) R cheek x 1, L cheek x 1, L forearm x 1  Symptomatic, irritating, patient would like treated.   Destruction of lesion - R cheek x 1, L cheek x 1, L forearm x 1 Complexity: simple   Destruction method: cryotherapy   Informed consent: discussed and consent obtained   Timeout:  patient name, date of birth, surgical site, and procedure verified Lesion destroyed using liquid nitrogen: Yes   Region frozen until ice ball extended beyond lesion: Yes   Outcome: patient tolerated procedure well with no complications   Post-procedure details: wound care instructions given    AK (actinic keratosis) (3) R nose x 2, L nose x 1  Destruction of lesion - R nose x 2, L nose x 1 Complexity: simple   Destruction method: cryotherapy   Informed consent: discussed and consent obtained   Timeout:  patient name, date of birth, surgical site, and procedure verified Lesion destroyed using liquid nitrogen: Yes   Region frozen until ice ball extended beyond lesion: Yes   Outcome: patient  tolerated procedure well with no complications   Post-procedure details: wound care instructions given    Actinic Damage - chronic, secondary to cumulative UV radiation exposure/sun exposure over time - diffuse scaly erythematous macules with underlying dyspigmentation - Recommend daily broad spectrum sunscreen SPF 30+ to sun-exposed areas, reapply every 2 hours as needed.  - Recommend staying in the shade or wearing long sleeves, sun glasses (UVA+UVB protection) and wide brim hats  (4-inch brim around the entire circumference of the hat). - Call for new or changing lesions.  Seborrheic Keratoses - Stuck-on, waxy, tan-brown papules and/or plaques  - Benign-appearing - Discussed benign etiology and prognosis. - Observe - Call for any changes  Lentigines - Scattered tan macules - Due to sun exposure - Benign-appearing, observe - Recommend daily broad spectrum sunscreen SPF 30+ to sun-exposed areas, reapply every 2 hours as needed. - Call for any changes  Return in about 4 months (around 07/02/2023) for Botox and AK follow up .  Luther Redo, CMA, am acting as scribe for Sarina Ser, MD . Documentation: I have reviewed the above documentation for accuracy and completeness, and I agree with the above.  Sarina Ser, MD

## 2023-03-04 ENCOUNTER — Encounter: Payer: Self-pay | Admitting: Dermatology

## 2023-07-08 ENCOUNTER — Ambulatory Visit: Payer: Medicare PPO | Admitting: Dermatology

## 2023-07-19 ENCOUNTER — Ambulatory Visit: Payer: Medicare PPO | Admitting: Dermatology

## 2023-07-19 ENCOUNTER — Encounter: Payer: Self-pay | Admitting: Dermatology

## 2023-07-19 VITALS — BP 134/79 | HR 85

## 2023-07-19 DIAGNOSIS — L821 Other seborrheic keratosis: Secondary | ICD-10-CM

## 2023-07-19 DIAGNOSIS — W908XXA Exposure to other nonionizing radiation, initial encounter: Secondary | ICD-10-CM

## 2023-07-19 DIAGNOSIS — L578 Other skin changes due to chronic exposure to nonionizing radiation: Secondary | ICD-10-CM

## 2023-07-19 DIAGNOSIS — L570 Actinic keratosis: Secondary | ICD-10-CM | POA: Diagnosis not present

## 2023-07-19 DIAGNOSIS — L820 Inflamed seborrheic keratosis: Secondary | ICD-10-CM | POA: Diagnosis not present

## 2023-07-19 DIAGNOSIS — L988 Other specified disorders of the skin and subcutaneous tissue: Secondary | ICD-10-CM | POA: Diagnosis not present

## 2023-07-19 DIAGNOSIS — L814 Other melanin hyperpigmentation: Secondary | ICD-10-CM

## 2023-07-19 NOTE — Patient Instructions (Signed)

## 2023-07-19 NOTE — Progress Notes (Signed)
Follow-Up Visit   Subjective  Kiara Donovan is a 72 y.o. female who presents for the following: Botox for facial elastosis. The patient has spots, moles and lesions to be evaluated, some may be new or changing and the patient may have concern these could be cancer.  The following portions of the chart were reviewed this encounter and updated as appropriate: medications, allergies, medical history  Review of Systems:  No other skin or systemic complaints except as noted in HPI or Assessment and Plan.  Objective  Well appearing patient in no apparent distress; mood and affect are within normal limits. A focused examination was performed of the face, back, arms, and legs Relevant physical exam findings are noted in the Assessment and Plan.    Chest x 8, L face x 2, R lower leg x 12, R thigh x 4, back x 4 (30) Erythematous stuck-on, waxy papule or plaque  R brow x 1, L brow x 1, R cheek x 1 (3) Erythematous thin papules/macules with gritty scale.    Assessment & Plan   Inflamed seborrheic keratosis (30) Chest x 8, L face x 2, R lower leg x 12, R thigh x 4, back x 4  Symptomatic, irritating, patient would like treated.   Destruction of lesion - Chest x 8, L face x 2, R lower leg x 12, R thigh x 4, back x 4 (30) Complexity: simple   Destruction method: cryotherapy   Informed consent: discussed and consent obtained   Timeout:  patient name, date of birth, surgical site, and procedure verified Lesion destroyed using liquid nitrogen: Yes   Region frozen until ice ball extended beyond lesion: Yes   Outcome: patient tolerated procedure well with no complications   Post-procedure details: wound care instructions given    AK (actinic keratosis) (3) R brow x 1, L brow x 1, R cheek x 1  Actinic keratoses are precancerous spots that appear secondary to cumulative UV radiation exposure/sun exposure over time. They are chronic with expected duration over 1 year. A portion of  actinic keratoses will progress to squamous cell carcinoma of the skin. It is not possible to reliably predict which spots will progress to skin cancer and so treatment is recommended to prevent development of skin cancer.  Recommend daily broad spectrum sunscreen SPF 30+ to sun-exposed areas, reapply every 2 hours as needed.  Recommend staying in the shade or wearing long sleeves, sun glasses (UVA+UVB protection) and wide brim hats (4-inch brim around the entire circumference of the hat). Call for new or changing lesions.   Destruction of lesion - R brow x 1, L brow x 1, R cheek x 1 (3) Complexity: simple   Destruction method: cryotherapy   Informed consent: discussed and consent obtained   Timeout:  patient name, date of birth, surgical site, and procedure verified Lesion destroyed using liquid nitrogen: Yes   Region frozen until ice ball extended beyond lesion: Yes   Outcome: patient tolerated procedure well with no complications   Post-procedure details: wound care instructions given     Facial Elastosis  Botox 50 units injected as marked:  - Frown complex 20 units - Crow's feet 10 units each for a total of 20 units  - B/L brow lift 5 units each side for a total of 10 units  Location: See attached image  Informed consent: Discussed risks (infection, pain, bleeding, bruising, swelling, allergic reaction, paralysis of nearby muscles, eyelid droop, double vision, neck weakness, difficulty breathing, headache,  undesirable cosmetic result, and need for additional treatment) and benefits of the procedure, as well as the alternatives.  Informed consent was obtained.  Preparation: The area was cleansed with alcohol.  Procedure Details:  Botox was injected into the dermis with a 30-gauge needle. Pressure applied to any bleeding. Ice packs offered for swelling.  Lot Number:  [id number] Expiration:  06/26  Total Units Injected:  50  Plan: Tylenol may be used for headache.  Allow 2 weeks  before returning to clinic for additional dosing as needed. Patient will call for any problems.  ACTINIC DAMAGE - chronic, secondary to cumulative UV radiation exposure/sun exposure over time - diffuse scaly erythematous macules with underlying dyspigmentation - Recommend daily broad spectrum sunscreen SPF 30+ to sun-exposed areas, reapply every 2 hours as needed.  - Recommend staying in the shade or wearing long sleeves, sun glasses (UVA+UVB protection) and wide brim hats (4-inch brim around the entire circumference of the hat). - Call for new or changing lesions.  SEBORRHEIC KERATOSIS - Stuck-on, waxy, tan-brown papules and/or plaques  - Benign-appearing - Discussed benign etiology and prognosis. - Observe - Call for any changes  LENTIGINES Exam: scattered tan macules Due to sun exposure Treatment Plan: Benign-appearing, observe. Recommend daily broad spectrum sunscreen SPF 30+ to sun-exposed areas, reapply every 2 hours as needed.  Call for any changes  Return in about 4 months (around 11/19/2023) for Botox injections.  Maylene Roes, CMA, am acting as scribe for Armida Sans, MD .  Documentation: I have reviewed the above documentation for accuracy and completeness, and I agree with the above.  Armida Sans, MD

## 2023-08-11 ENCOUNTER — Inpatient Hospital Stay
Admission: EM | Admit: 2023-08-11 | Discharge: 2023-08-14 | DRG: 640 | Disposition: A | Discharge status: Home / Self Care | Payer: Medicare PPO | Attending: Internal Medicine | Admitting: Internal Medicine

## 2023-08-11 ENCOUNTER — Other Ambulatory Visit: Payer: Self-pay

## 2023-08-11 ENCOUNTER — Emergency Department: Payer: Medicare PPO

## 2023-08-11 DIAGNOSIS — K529 Noninfective gastroenteritis and colitis, unspecified: Secondary | ICD-10-CM | POA: Diagnosis present

## 2023-08-11 DIAGNOSIS — E785 Hyperlipidemia, unspecified: Secondary | ICD-10-CM | POA: Diagnosis present

## 2023-08-11 DIAGNOSIS — Z8616 Personal history of COVID-19: Secondary | ICD-10-CM

## 2023-08-11 DIAGNOSIS — Y92009 Unspecified place in unspecified non-institutional (private) residence as the place of occurrence of the external cause: Secondary | ICD-10-CM

## 2023-08-11 DIAGNOSIS — Z79899 Other long term (current) drug therapy: Secondary | ICD-10-CM

## 2023-08-11 DIAGNOSIS — A0839 Other viral enteritis: Secondary | ICD-10-CM | POA: Diagnosis present

## 2023-08-11 DIAGNOSIS — T50995A Adverse effect of other drugs, medicaments and biological substances, initial encounter: Secondary | ICD-10-CM | POA: Diagnosis present

## 2023-08-11 DIAGNOSIS — G4700 Insomnia, unspecified: Secondary | ICD-10-CM | POA: Diagnosis present

## 2023-08-11 DIAGNOSIS — M810 Age-related osteoporosis without current pathological fracture: Secondary | ICD-10-CM | POA: Diagnosis present

## 2023-08-11 DIAGNOSIS — Z888 Allergy status to other drugs, medicaments and biological substances status: Secondary | ICD-10-CM

## 2023-08-11 DIAGNOSIS — E871 Hypo-osmolality and hyponatremia: Principal | ICD-10-CM

## 2023-08-11 DIAGNOSIS — Z8711 Personal history of peptic ulcer disease: Secondary | ICD-10-CM

## 2023-08-11 DIAGNOSIS — Z808 Family history of malignant neoplasm of other organs or systems: Secondary | ICD-10-CM

## 2023-08-11 DIAGNOSIS — Z885 Allergy status to narcotic agent status: Secondary | ICD-10-CM

## 2023-08-11 DIAGNOSIS — F419 Anxiety disorder, unspecified: Secondary | ICD-10-CM | POA: Diagnosis present

## 2023-08-11 DIAGNOSIS — U071 COVID-19: Secondary | ICD-10-CM | POA: Diagnosis present

## 2023-08-11 DIAGNOSIS — Z8249 Family history of ischemic heart disease and other diseases of the circulatory system: Secondary | ICD-10-CM

## 2023-08-11 DIAGNOSIS — R197 Diarrhea, unspecified: Secondary | ICD-10-CM

## 2023-08-11 DIAGNOSIS — R112 Nausea with vomiting, unspecified: Secondary | ICD-10-CM

## 2023-08-11 HISTORY — DX: Other viral enteritis: A08.39

## 2023-08-11 HISTORY — DX: Hypo-osmolality and hyponatremia: E87.1

## 2023-08-11 HISTORY — DX: Noninfective gastroenteritis and colitis, unspecified: K52.9

## 2023-08-11 LAB — COMPREHENSIVE METABOLIC PANEL
ALT: 31 U/L (ref 0–44)
AST: 37 U/L (ref 15–41)
Albumin: 3.9 g/dL (ref 3.5–5.0)
Alkaline Phosphatase: 49 U/L (ref 38–126)
Anion gap: 9 (ref 5–15)
BUN: 10 mg/dL (ref 8–23)
CO2: 23 mmol/L (ref 22–32)
Calcium: 8.5 mg/dL — ABNORMAL LOW (ref 8.9–10.3)
Chloride: 92 mmol/L — ABNORMAL LOW (ref 98–111)
Creatinine, Ser: 0.63 mg/dL (ref 0.44–1.00)
GFR, Estimated: >60 mL/min (ref >60)
Glucose, Bld: 130 mg/dL — ABNORMAL HIGH (ref 70–99)
Potassium: 4.5 mmol/L (ref 3.5–5.1)
Sodium: 124 mmol/L — ABNORMAL LOW (ref 135–145)
Total Bilirubin: 0.6 mg/dL (ref 0.3–1.2)
Total Protein: 7.3 g/dL (ref 6.5–8.1)

## 2023-08-11 LAB — CBC
HCT: 37.7 % (ref 36.0–46.0)
Hemoglobin: 12.9 g/dL (ref 12.0–15.0)
MCH: 31 pg (ref 26.0–34.0)
MCHC: 34.2 g/dL (ref 30.0–36.0)
MCV: 90.6 fL (ref 80.0–100.0)
Platelets: 158 10*3/uL (ref 150–400)
RBC: 4.16 MIL/uL (ref 3.87–5.11)
RDW: 12.7 % (ref 11.5–15.5)
WBC: 4.3 10*3/uL (ref 4.0–10.5)
nRBC: 0 % (ref 0.0–0.2)

## 2023-08-11 LAB — URINALYSIS, ROUTINE W REFLEX MICROSCOPIC
Bacteria, UA: NONE SEEN
Bilirubin Urine: NEGATIVE
Glucose, UA: NEGATIVE mg/dL
Ketones, ur: 20 mg/dL — AB
Leukocytes,Ua: NEGATIVE
Nitrite: NEGATIVE
Protein, ur: 30 mg/dL — AB
Specific Gravity, Urine: 1.02 (ref 1.005–1.030)
pH: 5 (ref 5.0–8.0)

## 2023-08-11 LAB — BASIC METABOLIC PANEL
Anion gap: 8 (ref 5–15)
BUN: 8 mg/dL (ref 8–23)
CO2: 23 mmol/L (ref 22–32)
Calcium: 7.7 mg/dL — ABNORMAL LOW (ref 8.9–10.3)
Chloride: 93 mmol/L — ABNORMAL LOW (ref 98–111)
Creatinine, Ser: 0.61 mg/dL (ref 0.44–1.00)
GFR, Estimated: >60 mL/min (ref >60)
Glucose, Bld: 109 mg/dL — ABNORMAL HIGH (ref 70–99)
Potassium: 4.4 mmol/L (ref 3.5–5.1)
Sodium: 124 mmol/L — ABNORMAL LOW (ref 135–145)

## 2023-08-11 LAB — LIPASE, BLOOD: Lipase: 29 U/L (ref 11–51)

## 2023-08-11 MED ORDER — SODIUM CHLORIDE 0.9 % IV BOLUS
1000.0000 mL | Freq: Once | INTRAVENOUS | Status: AC
  Administered 2023-08-11: 1000 mL via INTRAVENOUS

## 2023-08-11 MED ORDER — ACETAMINOPHEN 325 MG PO TABS
650.0000 mg | ORAL_TABLET | Freq: Four times a day (QID) | ORAL | Status: DC | PRN
  Administered 2023-08-12 – 2023-08-13 (×6): 650 mg via ORAL
  Filled 2023-08-11 (×7): qty 2

## 2023-08-11 MED ORDER — ATORVASTATIN CALCIUM 20 MG PO TABS
40.0000 mg | ORAL_TABLET | Freq: Every day | ORAL | Status: DC
  Administered 2023-08-13 – 2023-08-14 (×2): 40 mg via ORAL
  Filled 2023-08-11 (×3): qty 2

## 2023-08-11 MED ORDER — ONDANSETRON HCL 4 MG PO TABS
4.0000 mg | ORAL_TABLET | Freq: Four times a day (QID) | ORAL | Status: DC | PRN
  Administered 2023-08-13: 4 mg via ORAL
  Filled 2023-08-11: qty 1

## 2023-08-11 MED ORDER — IOHEXOL 300 MG/ML  SOLN
100.0000 mL | Freq: Once | INTRAMUSCULAR | Status: AC | PRN
  Administered 2023-08-11: 100 mL via INTRAVENOUS

## 2023-08-11 MED ORDER — ACETAMINOPHEN 650 MG RE SUPP: 650.0000 mg | Freq: Four times a day (QID) | RECTAL | Status: DC | PRN

## 2023-08-11 MED ORDER — ONDANSETRON HCL 4 MG/2ML IJ SOLN
4.0000 mg | Freq: Four times a day (QID) | INTRAMUSCULAR | Status: DC | PRN
  Administered 2023-08-11 – 2023-08-12 (×3): 4 mg via INTRAVENOUS
  Filled 2023-08-11 (×3): qty 2

## 2023-08-11 MED ORDER — ENOXAPARIN SODIUM 40 MG/0.4ML IJ SOSY: 40.0000 mg | PREFILLED_SYRINGE | INTRAMUSCULAR | Status: DC

## 2023-08-11 MED ORDER — ENOXAPARIN SODIUM 40 MG/0.4ML IJ SOSY
40.0000 mg | PREFILLED_SYRINGE | INTRAMUSCULAR | Status: DC
  Administered 2023-08-12 – 2023-08-14 (×3): 40 mg via SUBCUTANEOUS
  Filled 2023-08-11 (×3): qty 0.4

## 2023-08-11 MED ORDER — SODIUM CHLORIDE 0.9 % IV SOLN: INTRAVENOUS | Status: AC

## 2023-08-11 MED ORDER — HYOSCYAMINE SULFATE 0.125 MG SL SUBL: 0.1250 mg | SUBLINGUAL_TABLET | SUBLINGUAL | Status: DC | PRN

## 2023-08-11 NOTE — H&P (Signed)
History and Physical    Patient: Kiara Donovan:[id number] DOB: 05/21/1951 DOA: 08/11/2023 DOS: the patient was seen and examined on 08/11/2023 PCP: Jerl Mina, MD  Patient coming from: Home  Chief Complaint:  Chief Complaint  Patient presents with   Abdominal Pain    HPI: Kiara Donovan is a 72 y.o. female with medical history significant for Anxiety and childhood rheumatic fever, prior colitis, diagnosed with COVID with mostly respiratory symptoms (symptom onset 8/18) and started on Paxlovid 8/19, who presents to the ED after developing nausea vomiting and diarrhea the day after starting Paxlovid with the last dose tolerated being on 8/21.  Vomiting was nonbloody and nonbilious.  She states the tablets were big and had a bitter taste and she wondered if her symptoms developed because of the medication.  She however states she has a sensitive stomach which easily gets upset.  She denies fever or chills.  Has occasional abdominal cramping with vomiting.  Denies shortness of breath.  Her respiratory symptoms are mainly runny nose and feeling congestion in her throat with throat clearing but feels like it has been improving ED course and data review:Marland Kitchen  Vitals within normal limits Labs notable for sodium of 124 that remained at 1 2:24 liter NS bolus other labs including CBC, lipase, UA and remainder of CMP unremarkable CT abdomen and pelvis showed findings suspicious for mild diffuse colitis Patient treated with antiemetics with some improvement in vomiting. Hospitalist consulted for admission due to unchanged hyponatremia with normal saline administration.   Review of Systems: As mentioned in the history of present illness. All other systems reviewed and are negative.  Past Medical History:  Diagnosis Date   Actinic keratosis    Anxiety    Cancer (HCC) 10/2016   left breast lumpectomy   Headache    migraines   HLD (hyperlipidemia)    Osteoporosis    Rheumatic disease     Stomach ulcer    Ulcer of bladder    Vestibulitis, vulvar    seen at [facility]   Past Surgical History:  Procedure Laterality Date   BREAST SURGERY Left    COLONOSCOPY  12/21/2002   Dr. Christella Noa, normal   COLONOSCOPY WITH PROPOFOL N/A 12/01/2021   Procedure: COLONOSCOPY WITH PROPOFOL;  Surgeon: Regis Bill, MD;  Location: [facility] ENDOSCOPY;  Service: Gastroenterology;  Laterality: N/A;   ESOPHAGOGASTRODUODENOSCOPY (EGD) WITH PROPOFOL N/A 12/01/2021   Procedure: ESOPHAGOGASTRODUODENOSCOPY (EGD) WITH PROPOFOL;  Surgeon: Regis Bill, MD;  Location: [facility] ENDOSCOPY;  Service: Gastroenterology;  Laterality: N/A;   MASTECTOMY     RHINOPLASTY     TONSILLECTOMY     VAGINAL DELIVERY     Social History:  reports that she has never smoked. She has never used smokeless tobacco. She reports that she does not drink alcohol and does not use drugs.  Allergies  Allergen Reactions   Neomycin-Bacitracin Zn-Polymyx Other (See Comments)    Itchy; red eyes to drops    Tape Other (See Comments)    Other reaction(s): Other (See Comments) Skin irritation. Prefers paper tape. Skin irritation. Prefers paper tape.    Trazodone     Other reaction(s): Headache    Family History  Problem Relation Age of Onset   Cancer Mother        cervical and melanoma   Thyroid nodules Mother    Cancer Father        prostate cancer   Heart disease Father        s/p  CABG   Cancer Sister        melanoma   Heart disease Maternal Grandmother    Heart disease Paternal Grandmother    Heart disease Paternal Grandfather    Miscarriages / Stillbirths Paternal Grandfather     Prior to Admission medications   Medication Sig Donovan Date End Date Taking? Authorizing Provider  atorvastatin (LIPITOR) 40 MG tablet Take 40 mg by mouth daily. 05/30/22   [provider]  Biotin 1 MG CAPS Take by mouth.    [provider]  Cholecalciferol (D3 ADULT PO) Take by mouth.    [provider]  ciprofloxacin-dexamethasone (CIPRODEX) OTIC suspension Apply 1-2 drops to toe after soaking BID 09/30/22   Hyatt, Max T, DPM  clorazepate (TRANXENE) 7.5 MG tablet 1.5 tablets by mouth daily prn 12/24/17   Glori Luis, MD  hyoscyamine (LEVSIN SL) 0.125 MG SL tablet Place under the tongue every 4 (four) hours as needed. 06/27/22   [provider]  Multiple Vitamin (MULTIVITAMIN) capsule Take 1 capsule by mouth daily.      [provider]  mupirocin ointment (BACTROBAN) 2 % Apply 1 Application topically 2 (two) times daily. 10/06/22   Hyatt, Max T, DPM  ondansetron (ZOFRAN-ODT) 4 MG disintegrating tablet Take 1 tablet (4 mg total) by mouth every 8 (eight) hours as needed. 10/10/22   Shaune Pollack, MD  pantoprazole (PROTONIX) 40 MG tablet Take 40 mg by mouth daily. 05/30/22   [provider]  phenylephrine-shark liver oil-mineral oil-petrolatum (PREPARATION H) 0.25-14-74.9 % rectal ointment Place 1 application rectally 2 (two) times daily as needed for hemorrhoids. 09/22/21   Georga Hacking, MD  Probiotic Product (PHILLIPS COLON HEALTH PO) Take by mouth.    [provider]  terbinafine (LAMISIL) 250 MG tablet Take 1 tablet (250 mg total) by mouth daily. Patient not taking: Reported on 03/02/2023 09/09/22   Elinor Parkinson, [place]    Physical Exam: Vitals:   08/11/23 1845 08/11/23 1900 08/11/23 1936 08/11/23 2000  BP: (!) 148/124 (!) 143/84  130/81  Pulse: 77 72  71  Resp:      Temp:   99 F (37.2 C)   TempSrc:   Oral   SpO2: 99% 98%  98%   Physical Exam Vitals and nursing note reviewed.  Constitutional:      General: She is not in acute distress. HENT:     Head: Normocephalic and atraumatic.  Cardiovascular:     Rate and Rhythm: Normal rate and regular rhythm.     Heart sounds: Normal heart sounds.  Pulmonary:     Effort: Pulmonary effort is normal.     Breath sounds: Normal breath sounds.  Abdominal:     Palpations: Abdomen is soft.      Tenderness: There is no abdominal tenderness.  Neurological:     Mental Status: Mental status is at baseline.     Labs on Admission: I have personally reviewed following labs and imaging studies  CBC: Recent Labs  Lab 08/11/23 1434  WBC 4.3  HGB 12.9  HCT 37.7  MCV 90.6  PLT 158   Basic Metabolic Panel: Recent Labs  Lab 08/11/23 1434 08/11/23 1936  NA 124* 124*  K 4.5 4.4  CL 92* 93*  CO2 23 23  GLUCOSE 130* 109*  BUN 10 8  CREATININE 0.63 0.61  CALCIUM 8.5* 7.7*   GFR: CrCl cannot be calculated (Unknown ideal weight.). Liver Function Tests: Recent Labs  Lab 08/11/23 1434  AST  37  ALT 31  ALKPHOS 49  BILITOT 0.6  PROT 7.3  ALBUMIN 3.9   Recent Labs  Lab 08/11/23 1434  LIPASE 29   No results for input(s): "AMMONIA" in the last 168 hours. Coagulation Profile: No results for input(s): "INR", "PROTIME" in the last 168 hours. Cardiac Enzymes: No results for input(s): "CKTOTAL", "CKMB", "CKMBINDEX", "TROPONINI" in the last 168 hours. BNP (last 3 results) No results for input(s): "PROBNP" in the last 8760 hours. HbA1C: No results for input(s): "HGBA1C" in the last 72 hours. CBG: No results for input(s): "GLUCAP" in the last 168 hours. Lipid Profile: No results for input(s): "CHOL", "HDL", "LDLCALC", "TRIG", "CHOLHDL", "LDLDIRECT" in the last 72 hours. Thyroid Function Tests: No results for input(s): "TSH", "T4TOTAL", "FREET4", "T3FREE", "THYROIDAB" in the last 72 hours. Anemia Panel: No results for input(s): "VITAMINB12", "FOLATE", "FERRITIN", "TIBC", "IRON", "RETICCTPCT" in the last 72 hours. Urine analysis:    Component Value Date/Time   COLORURINE YELLOW (A) 08/11/2023 1636   APPEARANCEUR HAZY (A) 08/11/2023 1636   APPEARANCEUR Clear 12/14/2013 1445   LABSPEC 1.020 08/11/2023 1636   LABSPEC 1.018 12/14/2013 1445   PHURINE 5.0 08/11/2023 1636   GLUCOSEU NEGATIVE 08/11/2023 1636   GLUCOSEU Negative 12/14/2013 1445   HGBUR SMALL (A) 08/11/2023  1636   BILIRUBINUR NEGATIVE 08/11/2023 1636   BILIRUBINUR Negative 12/14/2013 1445   KETONESUR 20 (A) 08/11/2023 1636   PROTEINUR 30 (A) 08/11/2023 1636   NITRITE NEGATIVE 08/11/2023 1636   LEUKOCYTESUR NEGATIVE 08/11/2023 1636   LEUKOCYTESUR Negative 12/14/2013 1445    Radiological Exams on Admission: CT ABDOMEN PELVIS W CONTRAST  Result Date: 08/11/2023 CLINICAL DATA:  Acute abdominal pain.  COVID. EXAM: CT ABDOMEN AND PELVIS WITH CONTRAST TECHNIQUE: Multidetector CT imaging of the abdomen and pelvis was performed using the standard protocol following bolus administration of intravenous contrast. RADIATION DOSE REDUCTION: This exam was performed according to the departmental dose-optimization program which includes automated exposure control, adjustment of the mA and/or kV according to patient size and/or use of iterative reconstruction technique. CONTRAST:  OMNIPAQUE IOHEXOL 300 MG/ML  SOLN COMPARISON:  CT abdomen and pelvis 12/11/2019 FINDINGS: Lower chest: No acute abnormality. Hepatobiliary: No focal liver abnormality is seen. No gallstones, gallbladder wall thickening, or biliary dilatation. Pancreas: Unremarkable. No pancreatic ductal dilatation or surrounding inflammatory changes. Spleen: Normal in size without focal abnormality. Adrenals/Urinary Tract: There is a subcentimeter hypodensity in the right kidney which is too small to characterize, likely a cyst. Otherwise, the kidneys, adrenal glands and bladder are within normal limits. Stomach/Bowel: The colon is completely decompressed and mild diffuse colonic wall thickening can not be excluded. The appendix, small bowel and stomach are within normal limits. No focal inflammation, bowel obstruction or pneumatosis. Colonic diverticula are present. Vascular/Lymphatic: Aorta and IVC are normal in size. There are atherosclerotic calcifications of the aorta. There are prominent left pelvic vessels, unchanged. No enlarged lymph nodes are seen.  Reproductive: Uterus and bilateral adnexa are unremarkable. Other: No abdominal wall hernia or abnormality. No abdominopelvic ascites. Musculoskeletal: No acute or significant osseous findings. IMPRESSION: 1. Findings suspicious for mild diffuse colitis. 2. Colonic diverticulosis. 3. Stable prominent left pelvic vessels. Correlate clinically for pelvic congestion syndrome. 4. Aortic atherosclerosis. Aortic Atherosclerosis (ICD10-I70.0). Electronically Signed   By: Darliss Cheney M.D.   On: 08/11/2023 18:17     Data Reviewed: Relevant notes from primary care and specialist visits, past discharge summaries as available in EHR, including Care Everywhere. Prior diagnostic testing as pertinent to current admission  diagnoses Updated medications and problem lists for reconciliation ED course, including vitals, labs, imaging, treatment and response to treatment Triage notes, nursing and pharmacy notes and ED provider's notes Notable results as noted in HPI   Assessment and Plan: * Hyponatremia Continue hydration with NS and monitor serum sodium  Gastroenteritis due to COVID-19 virus Acute colitis Possible Paxlovid intolerance Supportive care with IV fluids, IV antiemetics COVID precautions Patient concerned about taking more Paxlovid given that vomiting developed after taking it.  After extensive discussion, given that she is 5 days in from on set symptoms and respiratory symptoms have been improving, we will hold off.  Anxiety Continue Tranxene pending med rec     DVT prophylaxis: Lovenox  Consults: none  Advance Care Planning: full code  Family Communication: none  Disposition Plan: Back to previous home environment  Severity of Illness: The appropriate patient status for this patient is OBSERVATION. Observation status is judged to be reasonable and necessary in order to provide the required intensity of service to ensure the patient's safety. The patient's presenting symptoms,  physical exam findings, and initial radiographic and laboratory data in the context of their medical condition is felt to place them at decreased risk for further clinical deterioration. Furthermore, it is anticipated that the patient will be medically stable for discharge from the hospital within 2 midnights of admission.   Author: Andris Baumann, MD 08/11/2023 9:17 PM  For on call review [email or web address].

## 2023-08-11 NOTE — Assessment & Plan Note (Signed)
Continue Tranxene pending med rec

## 2023-08-11 NOTE — Assessment & Plan Note (Addendum)
Sodium 124 on admission, started on NS fluids.  Resolved with IV hydration. Sodium improved 135 Labs stable off IV fluids with PO intake improved. Repeat BMP at follow up

## 2023-08-11 NOTE — ED Notes (Signed)
First Nurse Note: Patient to ED via [facility] from home for weakness with N/V/D. Was dx with covid on Monday at [facility]. VS WNL. Took zofran and plaxlovid this AM at home.

## 2023-08-11 NOTE — Assessment & Plan Note (Addendum)
Acute colitis Paxlovid intolerance Supportive care with IV fluids, IV antiemetics COVID precautions Stopped Paxlovid given GI side effects Monitor renal function, electrolytes, hydration status -- these all improved with supportive care.

## 2023-08-11 NOTE — ED Provider Notes (Signed)
[facility] Provider Note  Patient Contact: 4:49 PM (approximate)   History   Abdominal Pain   HPI  Kiara Donovan is a 72 y.o. female who presents the emergency department COVID-positive.  Patient states that [facility], started on Paxlovid.  She is on her fourth dose of medicine, started with nausea, vomiting, diarrhea, lower abdominal pain with abdominal cramping.  Patient states that it has been repetitive episodes of emesis and diarrhea today and she has not been able to keep anything down.  Denies any chest pain, shortness of breath.  Patient states that she feels very weak.     Physical Exam   Triage Vital Signs: ED Triage Vitals [08/11/23 1431]  Encounter Vitals Group     BP 138/79     Systolic BP Percentile      Diastolic BP Percentile      Pulse Rate 69     Resp 18     Temp 98.7 F (37.1 C)     Temp Source Oral     SpO2 98 %     Weight      Height      Head Circumference      Peak Flow      Pain Score 10     Pain Loc      Pain Education      Exclude from Growth Chart     Most recent vital signs: Vitals:   08/11/23 1936 08/11/23 2000  BP:  130/81  Pulse:  71  Resp:    Temp: 99 F (37.2 C)   SpO2:  98%     General: Alert and in no acute distress. ENT:      Ears:       Nose: Moderate congestion/rhinnorhea.      Mouth/Throat: Mucous membranes are moist.  Cardiovascular:  Good peripheral perfusion Respiratory: Normal respiratory effort without tachypnea or retractions. Lungs CTAB. Good air entry to the bases with no decreased or absent breath sounds. Gastrointestinal: Bowel sounds 4 quadrants. Soft and nontender to palpation. No guarding or rigidity. No palpable masses. No distention. No CVA tenderness. Musculoskeletal: Full range of motion to all extremities.  Neurologic:  No gross focal neurologic deficits are appreciated.  Skin:   No rash noted Other:   ED Results / Procedures / Treatments    Labs (all labs ordered are listed, but only abnormal results are displayed) Labs Reviewed  COMPREHENSIVE METABOLIC PANEL - Abnormal; Notable for the following components:      Result Value   Sodium 124 (*)    Chloride 92 (*)    Glucose, Bld 130 (*)    Calcium 8.5 (*)    All other components within normal limits  URINALYSIS, ROUTINE W REFLEX MICROSCOPIC - Abnormal; Notable for the following components:   Color, Urine YELLOW (*)    APPearance HAZY (*)    Hgb urine dipstick SMALL (*)    Ketones, ur 20 (*)    Protein, ur 30 (*)    All other components within normal limits  BASIC METABOLIC PANEL - Abnormal; Notable for the following components:   Sodium 124 (*)    Chloride 93 (*)    Glucose, Bld 109 (*)    Calcium 7.7 (*)    All other components within normal limits  LIPASE, BLOOD  CBC  COMPREHENSIVE METABOLIC PANEL  CBC     EKG     RADIOLOGY  I personally viewed, evaluated, and interpreted  these images as part of my medical decision making, as well as reviewing the written report by the radiologist.  ED Provider Interpretation: Possible mild colitis no other acute findings  CT ABDOMEN PELVIS W CONTRAST  Result Date: 08/11/2023 CLINICAL DATA:  Acute abdominal pain.  COVID. EXAM: CT ABDOMEN AND PELVIS WITH CONTRAST TECHNIQUE: Multidetector CT imaging of the abdomen and pelvis was performed using the standard protocol following bolus administration of intravenous contrast. RADIATION DOSE REDUCTION: This exam was performed according to the departmental dose-optimization program which includes automated exposure control, adjustment of the mA and/or kV according to patient size and/or use of iterative reconstruction technique. CONTRAST:  OMNIPAQUE IOHEXOL 300 MG/ML  SOLN COMPARISON:  CT abdomen and pelvis 12/11/2019 FINDINGS: Lower chest: No acute abnormality. Hepatobiliary: No focal liver abnormality is seen. No gallstones, gallbladder wall thickening, or biliary  dilatation. Pancreas: Unremarkable. No pancreatic ductal dilatation or surrounding inflammatory changes. Spleen: Normal in size without focal abnormality. Adrenals/Urinary Tract: There is a subcentimeter hypodensity in the right kidney which is too small to characterize, likely a cyst. Otherwise, the kidneys, adrenal glands and bladder are within normal limits. Stomach/Bowel: The colon is completely decompressed and mild diffuse colonic wall thickening can not be excluded. The appendix, small bowel and stomach are within normal limits. No focal inflammation, bowel obstruction or pneumatosis. Colonic diverticula are present. Vascular/Lymphatic: Aorta and IVC are normal in size. There are atherosclerotic calcifications of the aorta. There are prominent left pelvic vessels, unchanged. No enlarged lymph nodes are seen. Reproductive: Uterus and bilateral adnexa are unremarkable. Other: No abdominal wall hernia or abnormality. No abdominopelvic ascites. Musculoskeletal: No acute or significant osseous findings. IMPRESSION: 1. Findings suspicious for mild diffuse colitis. 2. Colonic diverticulosis. 3. Stable prominent left pelvic vessels. Correlate clinically for pelvic congestion syndrome. 4. Aortic atherosclerosis. Aortic Atherosclerosis (ICD10-I70.0). Electronically Signed   By: Darliss Cheney M.D.   On: 08/11/2023 18:17    PROCEDURES:  Critical Care performed: No  Procedures   MEDICATIONS ORDERED IN ED: Medications  atorvastatin (LIPITOR) tablet 40 mg (has no administration in time range)  enoxaparin (LOVENOX) injection 40 mg (has no administration in time range)  acetaminophen (TYLENOL) tablet 650 mg (has no administration in time range)    Or  acetaminophen (TYLENOL) suppository 650 mg (has no administration in time range)  ondansetron (ZOFRAN) tablet 4 mg (has no administration in time range)    Or  ondansetron (ZOFRAN) injection 4 mg (has no administration in time range)  0.9 %  sodium chloride  infusion (has no administration in time range)  hyoscyamine (LEVSIN SL) SL tablet 0.125 mg (has no administration in time range)  sodium chloride 0.9 % bolus 1,000 mL (0 mLs Intravenous Stopped 08/11/23 1936)  iohexol (OMNIPAQUE) 300 MG/ML solution 100 mL (100 mLs Intravenous Contrast Given 08/11/23 1728)  sodium chloride 0.9 % bolus 1,000 mL (1,000 mLs Intravenous New Bag/Given 08/11/23 2020)     IMPRESSION / MDM / ASSESSMENT AND PLAN / ED COURSE  I reviewed the triage vital signs and the nursing notes.                                 Differential diagnosis includes, but is not limited to, COVID, medication side effect, colitis, UTI, dehydration   Patient's presentation is most consistent with acute presentation with potential threat to life or bodily function.   Patient's diagnosis is consistent with COVID-19, nausea vomiting diarrhea and  hyponatremia.  Patient presented to the emergency department with significant nausea vomiting and diarrhea for the last 24 hours.  Patient was diagnosed with COVID 3 days ago, started on Paxlovid.  Developed profound nausea vomiting diarrhea.  Unsure whether this is COVID driven symptoms or medication side effects.  Patient's labs revealed hyponatremia to a sodium of 124, otherwise labs are relatively reassuring.  Patient had imaging to ensure no other acute process in the abdomen to cause her nausea vomiting diarrhea.  There is possible mild colitis which I think is likely secondary to viral symptoms versus a true bacterial colitis.  Patient has been given antiemetics, fluids.  Despite having 2 L of fluids, her sodium remains at 124.  The emesis has been improving though patient still has diarrhea.  Given the fact that her sodium is still 124 despite fluid administration I feel that patient would be best suited with admission to the hospital.  Hospitalist team consulted and will agree to accept the patient at this time.Marland Kitchen     FINAL CLINICAL IMPRESSION(S) / ED  DIAGNOSES   Final diagnoses:  COVID-19  Hyponatremia  Nausea and vomiting, unspecified vomiting type  Diarrhea, unspecified type     Rx / DC Orders   ED Discharge Orders     None        Note:  This document was prepared using Dragon voice recognition software and may include unintentional dictation errors.   Lanette Hampshire 08/11/23 2131    Phineas Semen, MD 08/11/23 2140

## 2023-08-11 NOTE — ED Notes (Signed)
Pt informed of need for urine, hat provided. Pt denies any needs at this time, WCTM. Call light in reach.

## 2023-08-11 NOTE — ED Triage Notes (Addendum)
Pt to ED via POV from home. Pt reports tested COVID + at [facility] on Monday. Pt reports lower abdominal pain, N/V/D, cough/congestion and body aches. Pt is taking Paxlovid.

## 2023-08-12 DIAGNOSIS — E871 Hypo-osmolality and hyponatremia: Secondary | ICD-10-CM | POA: Diagnosis not present

## 2023-08-12 LAB — COMPREHENSIVE METABOLIC PANEL
ALT: 27 U/L (ref 0–44)
AST: 33 U/L (ref 15–41)
Albumin: 3.3 g/dL — ABNORMAL LOW (ref 3.5–5.0)
Alkaline Phosphatase: 39 U/L (ref 38–126)
Anion gap: 6 (ref 5–15)
BUN: 7 mg/dL — ABNORMAL LOW (ref 8–23)
CO2: 23 mmol/L (ref 22–32)
Calcium: 7.9 mg/dL — ABNORMAL LOW (ref 8.9–10.3)
Chloride: 102 mmol/L (ref 98–111)
Creatinine, Ser: 0.62 mg/dL (ref 0.44–1.00)
GFR, Estimated: >60 mL/min (ref >60)
Glucose, Bld: 93 mg/dL (ref 70–99)
Potassium: 3.5 mmol/L (ref 3.5–5.1)
Sodium: 131 mmol/L — ABNORMAL LOW (ref 135–145)
Total Bilirubin: 0.5 mg/dL (ref 0.3–1.2)
Total Protein: 6.1 g/dL — ABNORMAL LOW (ref 6.5–8.1)

## 2023-08-12 LAB — CBC
HCT: 35.2 % — ABNORMAL LOW (ref 36.0–46.0)
Hemoglobin: 12 g/dL (ref 12.0–15.0)
MCH: 31.3 pg (ref 26.0–34.0)
MCHC: 34.1 g/dL (ref 30.0–36.0)
MCV: 91.9 fL (ref 80.0–100.0)
Platelets: 142 10*3/uL — ABNORMAL LOW (ref 150–400)
RBC: 3.83 MIL/uL — ABNORMAL LOW (ref 3.87–5.11)
RDW: 13.1 % (ref 11.5–15.5)
WBC: 3.7 10*3/uL — ABNORMAL LOW (ref 4.0–10.5)
nRBC: 0 % (ref 0.0–0.2)

## 2023-08-12 MED ORDER — DM-GUAIFENESIN ER 30-600 MG PO TB12: 1.0000 | ORAL_TABLET | Freq: Two times a day (BID) | ORAL | Status: DC | PRN

## 2023-08-12 MED ORDER — PANTOPRAZOLE SODIUM 40 MG IV SOLR
40.0000 mg | Freq: Every day | INTRAVENOUS | Status: DC
  Administered 2023-08-12: 40 mg via INTRAVENOUS
  Filled 2023-08-12: qty 10

## 2023-08-12 MED ORDER — SODIUM CHLORIDE 0.9 % IV SOLN: INTRAVENOUS | Status: DC

## 2023-08-12 MED ORDER — KETOROLAC TROMETHAMINE 15 MG/ML IJ SOLN
15.0000 mg | Freq: Three times a day (TID) | INTRAMUSCULAR | Status: DC | PRN
  Administered 2023-08-12 – 2023-08-13 (×3): 15 mg via INTRAVENOUS
  Filled 2023-08-12 (×3): qty 1

## 2023-08-12 MED ORDER — POTASSIUM CHLORIDE 10 MEQ/100ML IV SOLN
10.0000 meq | INTRAVENOUS | Status: AC
  Administered 2023-08-12 (×3): 10 meq via INTRAVENOUS
  Filled 2023-08-12 (×3): qty 100

## 2023-08-12 MED ORDER — TEMAZEPAM 15 MG PO CAPS
15.0000 mg | ORAL_CAPSULE | Freq: Every evening | ORAL | Status: DC | PRN
  Administered 2023-08-13 (×2): 15 mg via ORAL
  Filled 2023-08-12 (×3): qty 1

## 2023-08-12 MED ORDER — CLORAZEPATE DIPOTASSIUM 7.5 MG PO TABS
7.5000 mg | ORAL_TABLET | Freq: Every day | ORAL | Status: DC | PRN
  Filled 2023-08-12: qty 1

## 2023-08-12 NOTE — Assessment & Plan Note (Addendum)
Mild diffuse colitis on CT abd/pelvis.  No fever or leukocytosis.   Pt has history of same in April/May, [facility] follow up note reviewed.  Pt was treated with Cipro Flagyl but symptoms did not improve. Stool cultures were negative. Normal colonoscopy in Dec 2022. Pt has abdominal cramping pain intermittently and diarrhea, non-bloody (prior episode was bloody) --Hold off antibiotics --Levsin SL PRN for abdominal cramps --Suspect Covid related or Paxlovid side effect given timing of onset --GI follow up 8/23 -- symptoms improving

## 2023-08-12 NOTE — ED Notes (Signed)
Pt resting denies needs at this time.

## 2023-08-12 NOTE — Hospital Course (Signed)
HPI on admission:  " Kiara Donovan is a 72 y.o. female with medical history significant for Anxiety and childhood rheumatic fever, prior colitis, diagnosed with COVID with mostly respiratory symptoms (symptom onset 8/18) and started on Paxlovid 8/19, who presents to the ED after developing nausea vomiting and diarrhea the day after starting Paxlovid with the last dose tolerated being on 8/21.  Vomiting was nonbloody and nonbilious.  She states the tablets were big and had a bitter taste and she wondered if her symptoms developed because of the medication.  She however states she has a sensitive stomach which easily gets upset.  She denies fever or chills.  Has occasional abdominal cramping with vomiting.  Denies shortness of breath.  Her respiratory symptoms are mainly runny nose and feeling congestion in her throat with throat clearing but feels like it has been improving ED course and data review:Marland Kitchen  Vitals within normal limits Labs notable for sodium of 124 that remained at 1 2:24 liter NS bolus other labs including CBC, lipase, UA and remainder of CMP unremarkable CT abdomen and pelvis showed findings suspicious for mild diffuse colitis Patient treated with antiemetics with some improvement in vomiting. Hospitalist consulted for admission due to unchanged hyponatremia with normal saline administration. "   Pt was admitted and started on IV normal saline for hyponatremia, symptomatic care for Covid symptoms.  Paxlovid was not continued due to onset of GI symptoms after this was started.  Further hospital course and management as outlined below.

## 2023-08-12 NOTE — Progress Notes (Signed)
Assumed care of this patient who arrived to this unit from ED at 2215 . Patient is alert and oriented. On RA, sats are WNL's. Breathing is even and unlabored. Skin assessment done, skin is unremarkeable. Denies pain or discomfort. Patient was oriented to room and call bell placed within reach.

## 2023-08-12 NOTE — Progress Notes (Signed)
Progress Note   Patient: Kiara Donovan NGE:[id number] DOB: [birth year] DOA: 08/11/2023     0 DOS: the patient was seen and examined on 08/12/2023   Brief hospital course: HPI on admission:  " Kiara Donovan is a 72 y.o. female with medical history significant for Anxiety and childhood rheumatic fever, prior colitis, diagnosed with COVID with mostly respiratory symptoms (symptom onset 8/18) and started on Paxlovid 8/19, who presents to the ED after developing nausea vomiting and diarrhea the day after starting Paxlovid with the last dose tolerated being on 8/21.  Vomiting was nonbloody and nonbilious.  She states the tablets were big and had a bitter taste and she wondered if her symptoms developed because of the medication.  She however states she has a sensitive stomach which easily gets upset.  She denies fever or chills.  Has occasional abdominal cramping with vomiting.  Denies shortness of breath.  Her respiratory symptoms are mainly runny nose and feeling congestion in her throat with throat clearing but feels like it has been improving ED course and data review:Marland Kitchen  Vitals within normal limits Labs notable for sodium of 124 that remained at 1 2:24 liter NS bolus other labs including CBC, lipase, UA and remainder of CMP unremarkable CT abdomen and pelvis showed findings suspicious for mild diffuse colitis Patient treated with antiemetics with some improvement in vomiting. Hospitalist consulted for admission due to unchanged hyponatremia with normal saline administration. "   Pt was admitted and started on IV normal saline for hyponatremia, symptomatic care for Covid symptoms.  Paxlovid was not continued due to onset of GI symptoms after this was started.  Further hospital course and management as outlined below.   Assessment and Plan: * Hyponatremia Sodium 124 on admission, started on NS fluids. Sodium improved 131 this AM. --Reduce fluids to maintenance rate until PO intake  improves --Daily BMP's  Acute colitis Mild diffuse colitis on CT abd/pelvis.  No fever or leukocytosis.   Pt has history of same in April/May, [facility] follow up note reviewed.  Pt was treated with Cipro Flagyl but symptoms did not improve. Stool cultures were negative. Normal colonoscopy in Dec 2022. Pt has abdominal cramping pain intermittently and diarrhea, non-bloody (prior episode was bloody) --Hold off antibiotics --Levsin SL PRN for abdominal cramps --Suspect Covid related or Paxlovid side effect given timing of onset --GI follow up, will consult if not improving   Gastroenteritis due to COVID-19 virus Acute colitis Possible Paxlovid intolerance Supportive care with IV fluids, IV antiemetics COVID precautions Stop Paxlovid given GI side effects Monitor renal function, electrolytes, hydration status  Anxiety --Continue home Tranxene   Insomnia --continue home Restoril PRN        Subjective: Pt seen in ED, holding for a bed this AM, husband at bedside. She reports ongoing intermittent abdominal cramping and diarrhea.  Reports GI history and prior colitis episode.  This time GI symptoms started after she started paxlovid, was not having them. Covid symptoms started with sore throat, congestion and severe post nasal drainage, productive cough. These are improving.   Physical Exam: Vitals:   08/12/23 1555 08/12/23 1600 08/12/23 1700 08/12/23 1744  BP: (!) 142/84 (!) 149/88 (!) 156/87   Pulse: 78 73    Resp:      Temp:    98.9 F (37.2 C)  TempSrc:    Oral  SpO2: 98% 100%    Weight:      Height:       General exam:  awake, alert, no acute distress, mildly ill appearing HEENT: wearing mash, hearing grossly normal  Respiratory system: CTAB but generally diminished, no wheezes, rales or rhonchi, normal respiratory effort. Cardiovascular system: normal S1/S2, RRR, no JVD, murmurs, rubs, gallops, no pedal edema.   Gastrointestinal system: soft, mildly tender,  non-distended, +bowel sounds. Central nervous system: A&O x 3. no gross focal neurologic deficits, normal speech Extremities: moves all, no edema, normal tone Skin: dry, intact, normal temperature Psychiatry: normal mood, congruent affect, judgement and insight appear normal   Data Reviewed:  Notable labs --- Na improved 124 >> 131, BUN 7, Ca 7.9, albumin 3.3, WBC 3.7, platelets  142k  Family Communication: at bedside on rounds  Disposition: Status is: Observation The patient remains OBS appropriate and will d/c before 2 midnights.  Planned Discharge Destination: Home    Time spent: 46 minutes  Author: Pennie Banter, DO 08/12/2023 6:42 PM  For on call review [email or web address].

## 2023-08-13 DIAGNOSIS — Z888 Allergy status to other drugs, medicaments and biological substances status: Secondary | ICD-10-CM | POA: Diagnosis not present

## 2023-08-13 DIAGNOSIS — F419 Anxiety disorder, unspecified: Secondary | ICD-10-CM | POA: Diagnosis present

## 2023-08-13 DIAGNOSIS — T50995A Adverse effect of other drugs, medicaments and biological substances, initial encounter: Secondary | ICD-10-CM | POA: Diagnosis present

## 2023-08-13 DIAGNOSIS — Z8616 Personal history of COVID-19: Secondary | ICD-10-CM | POA: Diagnosis not present

## 2023-08-13 DIAGNOSIS — E871 Hypo-osmolality and hyponatremia: Secondary | ICD-10-CM | POA: Diagnosis present

## 2023-08-13 DIAGNOSIS — A0839 Other viral enteritis: Secondary | ICD-10-CM | POA: Diagnosis present

## 2023-08-13 DIAGNOSIS — Z79899 Other long term (current) drug therapy: Secondary | ICD-10-CM | POA: Diagnosis not present

## 2023-08-13 DIAGNOSIS — M810 Age-related osteoporosis without current pathological fracture: Secondary | ICD-10-CM | POA: Diagnosis present

## 2023-08-13 DIAGNOSIS — U071 COVID-19: Secondary | ICD-10-CM | POA: Diagnosis present

## 2023-08-13 DIAGNOSIS — G4700 Insomnia, unspecified: Secondary | ICD-10-CM | POA: Diagnosis present

## 2023-08-13 DIAGNOSIS — Z8249 Family history of ischemic heart disease and other diseases of the circulatory system: Secondary | ICD-10-CM | POA: Diagnosis not present

## 2023-08-13 DIAGNOSIS — Z885 Allergy status to narcotic agent status: Secondary | ICD-10-CM | POA: Diagnosis not present

## 2023-08-13 DIAGNOSIS — Z8711 Personal history of peptic ulcer disease: Secondary | ICD-10-CM | POA: Diagnosis not present

## 2023-08-13 DIAGNOSIS — E785 Hyperlipidemia, unspecified: Secondary | ICD-10-CM | POA: Diagnosis present

## 2023-08-13 DIAGNOSIS — Y92009 Unspecified place in unspecified non-institutional (private) residence as the place of occurrence of the external cause: Secondary | ICD-10-CM | POA: Diagnosis not present

## 2023-08-13 DIAGNOSIS — Z808 Family history of malignant neoplasm of other organs or systems: Secondary | ICD-10-CM | POA: Diagnosis not present

## 2023-08-13 LAB — CBC
HCT: 34.6 % — ABNORMAL LOW (ref 36.0–46.0)
Hemoglobin: 11.7 g/dL — ABNORMAL LOW (ref 12.0–15.0)
MCH: 30.8 pg (ref 26.0–34.0)
MCHC: 33.8 g/dL (ref 30.0–36.0)
MCV: 91.1 fL (ref 80.0–100.0)
Platelets: 139 10*3/uL — ABNORMAL LOW (ref 150–400)
RBC: 3.8 MIL/uL — ABNORMAL LOW (ref 3.87–5.11)
RDW: 13.1 % (ref 11.5–15.5)
WBC: 3.9 10*3/uL — ABNORMAL LOW (ref 4.0–10.5)
nRBC: 0 % (ref 0.0–0.2)

## 2023-08-13 LAB — BASIC METABOLIC PANEL
Anion gap: 11 (ref 5–15)
BUN: 7 mg/dL — ABNORMAL LOW (ref 8–23)
CO2: 24 mmol/L (ref 22–32)
Calcium: 8.3 mg/dL — ABNORMAL LOW (ref 8.9–10.3)
Chloride: 100 mmol/L (ref 98–111)
Creatinine, Ser: 0.55 mg/dL (ref 0.44–1.00)
GFR, Estimated: >60 mL/min (ref >60)
Glucose, Bld: 81 mg/dL (ref 70–99)
Potassium: 3.8 mmol/L (ref 3.5–5.1)
Sodium: 135 mmol/L (ref 135–145)

## 2023-08-13 LAB — MAGNESIUM: Magnesium: 1.8 mg/dL (ref 1.7–2.4)

## 2023-08-13 MED ORDER — PANTOPRAZOLE SODIUM 40 MG PO TBEC
40.0000 mg | DELAYED_RELEASE_TABLET | Freq: Every day | ORAL | Status: DC
  Administered 2023-08-13: 40 mg via ORAL
  Filled 2023-08-13: qty 1

## 2023-08-13 MED ORDER — LACTATED RINGERS IV SOLN: INTRAVENOUS | Status: DC

## 2023-08-13 MED ORDER — POLYETHYLENE GLYCOL 3350 17 G PO PACK
17.0000 g | PACK | Freq: Every day | ORAL | Status: DC
  Administered 2023-08-13 – 2023-08-14 (×2): 17 g via ORAL
  Filled 2023-08-13 (×2): qty 1

## 2023-08-13 MED ORDER — SENNOSIDES-DOCUSATE SODIUM 8.6-50 MG PO TABS
1.0000 | ORAL_TABLET | Freq: Two times a day (BID) | ORAL | Status: DC
  Administered 2023-08-13 – 2023-08-14 (×2): 1 via ORAL
  Filled 2023-08-13 (×2): qty 1

## 2023-08-13 NOTE — Progress Notes (Signed)
Progress Note   Patient: Kiara Donovan ZOX:[id number] DOB: 03-Dec-1951 DOA: 08/11/2023     0 DOS: the patient was seen and examined on 08/13/2023   Brief hospital course: HPI on admission:  " Kiara Donovan is a 72 y.o. female with medical history significant for Anxiety and childhood rheumatic fever, prior colitis, diagnosed with COVID with mostly respiratory symptoms (symptom onset 8/18) and started on Paxlovid 8/19, who presents to the ED after developing nausea vomiting and diarrhea the day after starting Paxlovid with the last dose tolerated being on 8/21.  Vomiting was nonbloody and nonbilious.  She states the tablets were big and had a bitter taste and she wondered if her symptoms developed because of the medication.  She however states she has a sensitive stomach which easily gets upset.  She denies fever or chills.  Has occasional abdominal cramping with vomiting.  Denies shortness of breath.  Her respiratory symptoms are mainly runny nose and feeling congestion in her throat with throat clearing but feels like it has been improving ED course and data review:Marland Kitchen  Vitals within normal limits Labs notable for sodium of 124 that remained at 1 2:24 liter NS bolus other labs including CBC, lipase, UA and remainder of CMP unremarkable CT abdomen and pelvis showed findings suspicious for mild diffuse colitis Patient treated with antiemetics with some improvement in vomiting. Hospitalist consulted for admission due to unchanged hyponatremia with normal saline administration. "   Pt was admitted and started on IV normal saline for hyponatremia, symptomatic care for Covid symptoms.  Paxlovid was not continued due to onset of GI symptoms after this was started.  Further hospital course and management as outlined below.  8/22 - ongoing nausea, minimal PO intake, remains on IV fluids  Assessment and Plan: * Hyponatremia Sodium 124 on admission, started on NS fluids.  Resolved with IV  hydration. Sodium improved 135 this AM. --Continue IVF at maintenance until PO intake improves --Daily BMP's  Acute colitis Mild diffuse colitis on CT abd/pelvis.  No fever or leukocytosis.   Pt has history of same in April/May, [facility] follow up note reviewed.  Pt was treated with Cipro Flagyl but symptoms did not improve. Stool cultures were negative. Normal colonoscopy in Dec 2022. Pt has abdominal cramping pain intermittently and diarrhea, non-bloody (prior episode was bloody) --Hold off antibiotics --Levsin SL PRN for abdominal cramps --Suspect Covid related or Paxlovid side effect given timing of onset --GI follow up, will consult if not improving   Gastroenteritis due to COVID-19 virus Acute colitis Possible Paxlovid intolerance Supportive care with IV fluids, IV antiemetics COVID precautions Stop Paxlovid given GI side effects Monitor renal function, electrolytes, hydration status  Anxiety --Continue home Tranxene   Insomnia --continue home Restoril PRN        Subjective: Pt seen awake resting in bed today.  She reports ongoing nausea, keeps having to ask for Zofran.  Diarrhea has resolved, now feels like constipated.  Also reports ongoing headache that is only partly relieved with medication.  She expresses frustration at still feeling this poorly after symptoms started on Sunday last weekend.  Never had Covid previously.  Not able to eat much due to persistent nausea.   Physical Exam: Vitals:   08/12/23 2232 08/13/23 0431 08/13/23 0839 08/13/23 1147  BP: 93/79 (!) 155/75 (!) 154/84 (!) 154/87  Pulse: 71 72 78 85  Resp: 18 19 18 18   Temp: 98.5 F (36.9 C) 98.5 F (36.9 C) 98.2 F (36.8  C) 98.2 F (36.8 C)  TempSrc: Oral Oral    SpO2: 100% 98% 99% 99%  Weight:      Height:       General exam: awake, alert, no acute distress, ill-appearing HEENT: moist mucus membranes, hearing grossly normal  Respiratory system: CTAB, on room air, normal respiratory  effort. Cardiovascular system: RRR, no pedal edema.   Gastrointestinal system: soft, non-tender, non-distended, +bowel sounds. Central nervous system: A&O x 3. no gross focal neurologic deficits, normal speech Extremities: moves all, no edema, normal tone Skin: dry, intact, normal temperature Psychiatry: normal mood, congruent affect, judgement and insight appear normal   Data Reviewed:  Notable labs --- Na improved 124 >> 131 >> 135, BUN 7, Ca 8.3, WBC 3.9, platelets  142 >> 139k  Family Communication: at bedside on rounds 8/22. None present today, will attempt to call.  Disposition: Status is: Inpatient Remains inpatient appropriate because: ongoing nausea, not tolerating PO intake, remains of IV fluids     Planned Discharge Destination: Home    Time spent: 46 minutes  Author: Pennie Banter, DO 08/13/2023 3:57 PM  For on call review [email or web address].

## 2023-08-13 NOTE — Progress Notes (Signed)
Transition of Care [facility]) - Inpatient Brief Assessment   Patient Details  Name: Kiara Donovan MRN: [id number] Date of Birth: Mar 22, 1951  Transition of Care [facility] CM/SW Contact:    Darolyn Rua, LCSW Phone Number: 08/13/2023, 10:18 AM   Clinical Narrative:  PCP: Jerl Mina, MD Insurance: [facility] Medicare  Please consult TOC should discharge planning needs arise.   Transition of Care Asessment: Insurance and Status: Insurance coverage has been reviewed Patient has primary care physician: Yes Home environment has been reviewed: from home Prior level of function:: independent Prior/Current Home Services: No current home services Social Determinants of Health Reivew: SDOH reviewed no interventions necessary Readmission risk has been reviewed: Yes Transition of care needs: no transition of care needs at this time

## 2023-08-13 NOTE — Plan of Care (Signed)
  Problem: Respiratory: Goal: Will maintain a patent airway Outcome: Progressing Goal: Complications related to the disease process, condition or treatment will be avoided or minimized Outcome: Progressing   Problem: Coping: Goal: Psychosocial and spiritual needs will be supported Outcome: Progressing   Problem: Clinical Measurements: Goal: Ability to maintain clinical measurements within normal limits will improve Outcome: Progressing Goal: Will remain free from infection Outcome: Progressing Goal: Diagnostic test results will improve Outcome: Progressing Goal: Respiratory complications will improve Outcome: Progressing Goal: Cardiovascular complication will be avoided Outcome: Progressing   Problem: Pain Managment: Goal: General experience of comfort will improve Outcome: Progressing   Problem: Safety: Goal: Ability to remain free from injury will improve Outcome: Progressing

## 2023-08-13 NOTE — Progress Notes (Signed)
PHARMACIST - PHYSICIAN COMMUNICATION  CONCERNING: IV to Oral Route Change Policy  RECOMMENDATION: This patient is receiving pantoprazole by the intravenous route.  Based on criteria approved by the Pharmacy and Therapeutics Committee, the intravenous medication(s) is/are being converted to the equivalent oral dose form(s).  DESCRIPTION: These criteria include: The patient is eating (either orally or via tube) and/or has been taking other orally administered medications for a least 24 hours The patient has no evidence of active gastrointestinal bleeding or impaired GI absorption (gastrectomy, short bowel, patient on TNA or NPO).  If you have questions about this conversion, please contact the Pharmacy Department   Tressie Ellis, [facility] 08/13/2023 4:15 PM

## 2023-08-14 DIAGNOSIS — E871 Hypo-osmolality and hyponatremia: Secondary | ICD-10-CM | POA: Diagnosis not present

## 2023-08-14 LAB — BASIC METABOLIC PANEL
Anion gap: 7 (ref 5–15)
BUN: 8 mg/dL (ref 8–23)
CO2: 28 mmol/L (ref 22–32)
Calcium: 8.4 mg/dL — ABNORMAL LOW (ref 8.9–10.3)
Chloride: 100 mmol/L (ref 98–111)
Creatinine, Ser: 0.7 mg/dL (ref 0.44–1.00)
GFR, Estimated: >60 mL/min (ref >60)
Glucose, Bld: 90 mg/dL (ref 70–99)
Potassium: 4.2 mmol/L (ref 3.5–5.1)
Sodium: 135 mmol/L (ref 135–145)

## 2023-08-14 MED ORDER — HYOSCYAMINE SULFATE 0.125 MG SL SUBL: 0.1250 mg | SUBLINGUAL_TABLET | SUBLINGUAL | Status: AC | PRN

## 2023-08-14 MED ORDER — ONDANSETRON 8 MG PO TBDP: 8.0000 mg | ORAL_TABLET | Freq: Three times a day (TID) | ORAL | 0 refills | Status: AC | PRN

## 2023-08-14 NOTE — Discharge Summary (Addendum)
Physician Discharge Summary   Patient: Kiara Donovan MRN: [id number] DOB: 31-Aug-1951  Admit date:     08/11/2023  Discharge date: 08/14/2023  Discharge Physician: Pennie Banter   PCP: Jerl Mina, MD   Recommendations at discharge:    Follow up with Primary Care Repeat CBC, BMP at follow up  Discharge Diagnoses: Active Problems:   Anxiety  Principal Problem (Resolved):   Hyponatremia Resolved Problems:   Gastroenteritis due to COVID-19 virus   Acute colitis  Hospital Course: HPI on admission:  " Kiara Donovan is a 72 y.o. female with medical history significant for Anxiety and childhood rheumatic fever, prior colitis, diagnosed with COVID with mostly respiratory symptoms (symptom onset 8/18) and started on Paxlovid 8/19, who presents to the ED after developing nausea vomiting and diarrhea the day after starting Paxlovid with the last dose tolerated being on 8/21.  Vomiting was nonbloody and nonbilious.  She states the tablets were big and had a bitter taste and she wondered if her symptoms developed because of the medication.  She however states she has a sensitive stomach which easily gets upset.  She denies fever or chills.  Has occasional abdominal cramping with vomiting.  Denies shortness of breath.  Her respiratory symptoms are mainly runny nose and feeling congestion in her throat with throat clearing but feels like it has been improving ED course and data review:Marland Kitchen  Vitals within normal limits Labs notable for sodium of 124 that remained at 1 2:24 liter NS bolus other labs including CBC, lipase, UA and remainder of CMP unremarkable CT abdomen and pelvis showed findings suspicious for mild diffuse colitis Patient treated with antiemetics with some improvement in vomiting. Hospitalist consulted for admission due to unchanged hyponatremia with normal saline administration. "   Pt was admitted and started on IV normal saline for hyponatremia, symptomatic care for  Covid symptoms.  Paxlovid was not continued due to onset of GI symptoms after this was started.  Further hospital course and management as outlined below.  8/22 - ongoing nausea, minimal PO intake, remains on IV fluid  8/23 - pt improved, headache resolved, tolerating meals. Pt medically stable and requests discharge home today.   Assessment and Plan: * Hyponatremia-resolved as of 08/20/2023 Sodium 124 on admission, started on NS fluids.  Resolved with IV hydration. Sodium improved 135 Labs stable off IV fluids with PO intake improved. Repeat BMP at follow up  Anxiety --Continue home Tranxene   Insomnia --continue home Restoril PRN  Acute colitis-resolved as of 08/20/2023 Mild diffuse colitis on CT abd/pelvis.  No fever or leukocytosis.   Pt has history of same in April/May, [facility] follow up note reviewed.  Pt was treated with Cipro Flagyl but symptoms did not improve. Stool cultures were negative. Normal colonoscopy in Dec 2022. Pt has abdominal cramping pain intermittently and diarrhea, non-bloody (prior episode was bloody) --Hold off antibiotics --Levsin SL PRN for abdominal cramps --Suspect Covid related or Paxlovid side effect given timing of onset --GI follow up 8/23 -- symptoms improving   Gastroenteritis due to COVID-19 virus-resolved as of 08/20/2023 Acute colitis Paxlovid intolerance Supportive care with IV fluids, IV antiemetics COVID precautions Stopped Paxlovid given GI side effects Monitor renal function, electrolytes, hydration status -- these all improved with supportive care.         Consultants: none Procedures performed: none  Disposition: Home Diet recommendation:  Discharge Diet Orders (From admission, onward)     Start     Ordered  08/14/23 0000  Diet - low sodium heart healthy        08/14/23 1146            DISCHARGE MEDICATION: Allergies as of 08/14/2023       Reactions   Neomycin-bacitracin Zn-polymyx Other (See  Comments)   Itchy; red eyes to drops    Tape Other (See Comments)   Other reaction(s): Other (See Comments) Skin irritation. Prefers paper tape. Skin irritation. Prefers paper tape.   Trazodone    Other reaction(s): Headache        Medication List     STOP taking these medications    Paxlovid (300/100) 20 x 150 MG & 10 x 100MG  Tbpk Generic drug: nirmatrelvir & ritonavir       TAKE these medications    atorvastatin 40 MG tablet Commonly known as: LIPITOR Take 40 mg by mouth daily.   Biotin 1 MG Caps Take by mouth.   clorazepate 7.5 MG tablet Commonly known as: TRANXENE Take 7.5 mg by mouth daily as needed.   D3 ADULT PO Take by mouth.   hyoscyamine 0.125 MG SL tablet Commonly known as: LEVSIN SL Place 1 tablet (0.125 mg total) under the tongue every 4 (four) hours as needed. What changed:  how much to take Another medication with the same name was removed. Continue taking this medication, and follow the directions you see here.   multivitamin capsule Take 1 capsule by mouth daily.   ondansetron 8 MG disintegrating tablet Commonly known as: ZOFRAN-ODT Take 1 tablet (8 mg total) by mouth every 8 (eight) hours as needed.   pantoprazole 40 MG tablet Commonly known as: PROTONIX Take 40 mg by mouth daily.   PHILLIPS COLON HEALTH PO Take by mouth.   temazepam 15 MG capsule Commonly known as: RESTORIL Take 1 capsule (15 mg total) by mouth at bedtime as needed for Sleep for up to 180 days        Discharge Exam: Filed Weights   08/11/23 2200  Weight: 57.6 kg   General exam: awake, alert, no acute distress HEENT: atraumatic, clear conjunctiva, anicteric sclera, moist mucus membranes, hearing grossly normal  Respiratory system: CTAB, no wheezes, rales or rhonchi, normal respiratory effort. Cardiovascular system: normal S1/S2, RRR, no JVD, murmurs, rubs, gallops,  no pedal edema.   Gastrointestinal system: soft, NT, ND, no HSM felt, +bowel  sounds. Central nervous system: A&O x4. no gross focal neurologic deficits, normal speech Extremities: moves all , no edema, normal tone Skin: dry, intact, normal temperature, normal color, No rashes, lesions or ulcers Psychiatry: normal mood, congruent affect, judgement and insight appear normal   Condition at discharge: stable  The results of significant diagnostics from this hospitalization (including imaging, microbiology, ancillary and laboratory) are listed below for reference.   Imaging Studies: CT ABDOMEN PELVIS W CONTRAST  Result Date: 08/11/2023 CLINICAL DATA:  Acute abdominal pain.  COVID. EXAM: CT ABDOMEN AND PELVIS WITH CONTRAST TECHNIQUE: Multidetector CT imaging of the abdomen and pelvis was performed using the standard protocol following bolus administration of intravenous contrast. RADIATION DOSE REDUCTION: This exam was performed according to the departmental dose-optimization program which includes automated exposure control, adjustment of the mA and/or kV according to patient size and/or use of iterative reconstruction technique. CONTRAST:  OMNIPAQUE IOHEXOL 300 MG/ML  SOLN COMPARISON:  CT abdomen and pelvis 12/11/2019 FINDINGS: Lower chest: No acute abnormality. Hepatobiliary: No focal liver abnormality is seen. No gallstones, gallbladder wall thickening, or biliary dilatation. Pancreas: Unremarkable. No pancreatic  ductal dilatation or surrounding inflammatory changes. Spleen: Normal in size without focal abnormality. Adrenals/Urinary Tract: There is a subcentimeter hypodensity in the right kidney which is too small to characterize, likely a cyst. Otherwise, the kidneys, adrenal glands and bladder are within normal limits. Stomach/Bowel: The colon is completely decompressed and mild diffuse colonic wall thickening can not be excluded. The appendix, small bowel and stomach are within normal limits. No focal inflammation, bowel obstruction or pneumatosis. Colonic diverticula  are present. Vascular/Lymphatic: Aorta and IVC are normal in size. There are atherosclerotic calcifications of the aorta. There are prominent left pelvic vessels, unchanged. No enlarged lymph nodes are seen. Reproductive: Uterus and bilateral adnexa are unremarkable. Other: No abdominal wall hernia or abnormality. No abdominopelvic ascites. Musculoskeletal: No acute or significant osseous findings. IMPRESSION: 1. Findings suspicious for mild diffuse colitis. 2. Colonic diverticulosis. 3. Stable prominent left pelvic vessels. Correlate clinically for pelvic congestion syndrome. 4. Aortic atherosclerosis. Aortic Atherosclerosis (ICD10-I70.0). Electronically Signed   By: Darliss Cheney M.D.   On: 08/11/2023 18:17    Microbiology: Results for orders placed or performed during the hospital encounter of 10/10/22  Resp Panel by RT-PCR (Flu A&B, Covid) Anterior Nasal Swab     Status: None   Collection Time: 10/10/22 12:42 PM   Specimen: Anterior Nasal Swab  Result Value Ref Range Status   SARS Coronavirus 2 by RT PCR NEGATIVE NEGATIVE Final    Comment: (NOTE) SARS-CoV-2 target nucleic acids are NOT DETECTED.  The SARS-CoV-2 RNA is generally detectable in upper respiratory specimens during the acute phase of infection. The lowest concentration of SARS-CoV-2 viral copies this assay can detect is 138 copies/mL. A negative result does not preclude SARS-Cov-2 infection and should not be used as the sole basis for treatment or other patient management decisions. A negative result may occur with  improper specimen collection/handling, submission of specimen other than nasopharyngeal swab, presence of viral mutation(s) within the areas targeted by this assay, and inadequate number of viral copies(<138 copies/mL). A negative result must be combined with clinical observations, patient history, and epidemiological information. The expected result is Negative.  Fact Sheet for Patients:   BloggerCourse.com  Fact Sheet for Healthcare Providers:  SeriousBroker.it  This test is no t yet approved or cleared by the [place] FDA and  has been authorized for detection and/or diagnosis of SARS-CoV-2 by FDA under an Emergency Use Authorization (EUA). This EUA will remain  in effect (meaning this test can be used) for the duration of the COVID-19 declaration under Section 564(b)(1) of the Act, 21 U.S.C.section 360bbb-3(b)(1), unless the authorization is terminated  or revoked sooner.       Influenza A by PCR NEGATIVE NEGATIVE Final   Influenza B by PCR NEGATIVE NEGATIVE Final    Comment: (NOTE) The Xpert Xpress SARS-CoV-2/FLU/RSV plus assay is intended as an aid in the diagnosis of influenza from Nasopharyngeal swab specimens and should not be used as a sole basis for treatment. Nasal washings and aspirates are unacceptable for Xpert Xpress SARS-CoV-2/FLU/RSV testing.  Fact Sheet for Patients: BloggerCourse.com  Fact Sheet for Healthcare Providers: SeriousBroker.it  This test is not yet approved or cleared by the [place] FDA and has been authorized for detection and/or diagnosis of SARS-CoV-2 by FDA under an Emergency Use Authorization (EUA). This EUA will remain in effect (meaning this test can be used) for the duration of the COVID-19 declaration under Section 564(b)(1) of the Act, 21 U.S.C. section 360bbb-3(b)(1), unless the authorization is terminated or revoked.  Performed at [facility], [address] [place], [place] [address]   Culture, blood (routine x 2)     Status: None   Collection Time: 10/10/22 12:42 PM   Specimen: BLOOD  Result Value Ref Range Status   Specimen Description BLOOD RIGHT ANTECUBITAL  Final   Special Requests   Final    BOTTLES DRAWN AEROBIC AND ANAEROBIC Blood Culture adequate volume   Culture   Final    NO  GROWTH 5 DAYS Performed at [facility], [address] [place], [place] [address]    Report Status 10/15/2022 FINAL  Final    Labs: CBC: No results for input(s): "WBC", "NEUTROABS", "HGB", "HCT", "MCV", "PLT" in the last 168 hours.  Basic Metabolic Panel: Recent Labs  Lab 08/14/23 0509  NA 135  K 4.2  CL 100  CO2 28  GLUCOSE 90  BUN 8  CREATININE 0.70  CALCIUM 8.4*   Liver Function Tests: No results for input(s): "AST", "ALT", "ALKPHOS", "BILITOT", "PROT", "ALBUMIN" in the last 168 hours.  CBG: No results for input(s): "GLUCAP" in the last 168 hours.  Discharge time spent: less than 30 minutes.  Signed: Pennie Banter, DO [facility] 08/20/2023

## 2023-08-14 NOTE — Plan of Care (Signed)
  Problem: Respiratory: Goal: Will maintain a patent airway Outcome: Progressing   Problem: Education: Goal: Knowledge of General Education information will improve Description: Including pain rating scale, medication(s)/side effects and non-pharmacologic comfort measures Outcome: Progressing   Problem: Pain Managment: Goal: General experience of comfort will improve Outcome: Progressing   Problem: Safety: Goal: Ability to remain free from injury will improve Outcome: Progressing

## 2023-08-20 ENCOUNTER — Encounter: Payer: Self-pay | Admitting: Internal Medicine

## 2023-11-30 ENCOUNTER — Ambulatory Visit: Payer: Medicare PPO | Admitting: Dermatology

## 2023-11-30 DIAGNOSIS — Z7189 Other specified counseling: Secondary | ICD-10-CM

## 2023-11-30 DIAGNOSIS — D1723 Benign lipomatous neoplasm of skin and subcutaneous tissue of right leg: Secondary | ICD-10-CM | POA: Diagnosis not present

## 2023-11-30 DIAGNOSIS — L988 Other specified disorders of the skin and subcutaneous tissue: Secondary | ICD-10-CM | POA: Diagnosis not present

## 2023-11-30 NOTE — Patient Instructions (Addendum)
Lipoma  A lipoma is a noncancerous (benign) tumor that is made up of fat cells. This is a very common type of soft-tissue growth. Lipomas are usually found under the skin (subcutaneous). They may occur in any tissue of the body that contains fat. Common areas for lipomas to appear include the back, arms, shoulders, buttocks, and thighs. Lipomas grow slowly, and they are usually painless. Most lipomas do not cause problems and do not require treatment. What are the causes? The cause of this condition is not known. What increases the risk? You are more likely to develop this condition if: You are 44-75 years old. You have a family history of lipomas. What are the signs or symptoms? A lipoma usually appears as a small, round bump under the skin. In most cases, the lump will: Feel soft or rubbery. Not cause pain or other symptoms. However, if a lipoma is located in an area where it pushes on nerves, it can become painful or cause other symptoms. How is this diagnosed? A lipoma can usually be diagnosed with a physical exam. You may also have tests to confirm the diagnosis and to rule out other conditions. Tests may include: Imaging tests, such as a CT scan or an MRI. Removal of a tissue sample to be looked at under a microscope (biopsy). How is this treated? Treatment for this condition depends on the size of the lipoma and whether it is causing any symptoms. For small lipomas that are not causing problems, no treatment is needed. If a lipoma is bigger or it causes problems, surgery may be done to remove the lipoma. Lipomas can also be removed to improve appearance. Most often, the procedure is done after applying a medicine that numbs the area (local anesthetic). Liposuction may be done to reduce the size of the lipoma before it is removed through surgery, or it may be done to remove the lipoma. Lipomas are removed with this method to limit incision size and scarring. A liposuction tube is  inserted through a small incision into the lipoma, and the contents of the lipoma are removed through the tube with suction. Follow these instructions at home: Watch your lipoma for any changes. Keep all follow-up visits. This is important. Where to find more information OrthoInfo: orthoinfo.aaos.org Contact a health care provider if: Your lipoma becomes larger or hard. Your lipoma becomes painful, red, or increasingly swollen. These could be signs of infection or a more serious condition. Get help right away if: You develop tingling or numbness in an area near the lipoma. This could indicate that the lipoma is causing nerve damage. Summary A lipoma is a noncancerous tumor that is made up of fat cells. Most lipomas do not cause problems and do not require treatment. If a lipoma is bigger or it causes problems, surgery may be done to remove the lipoma. Contact a health care provider if your lipoma becomes larger or hard, or if it becomes painful, red, or increasingly swollen. These could be signs of infection or a more serious condition. This information is not intended to replace advice given to you by your health care provider. Make sure you discuss any questions you have with your health care provider. Document Revised: 12/26/2021 Document Reviewed: 12/26/2021 Elsevier Patient Education  2024 [employer]. Due to recent changes in healthcare laws, you may see results of your pathology and/or laboratory studies on MyChart before the doctors have had a chance to review them. We understand that in some cases there may  be results that are confusing or concerning to you. Please understand that not all results are received at the same time and often the doctors may need to interpret multiple results in order to provide you with the best plan of care or course of treatment. Therefore, we ask that you please give [place] 2 business days to thoroughly review all your results before contacting the office for  clarification. Should we see a critical lab result, you will be contacted sooner.   If You Need Anything After Your Visit  If you have any questions or concerns for your doctor, please call our main line at [phone] and press option 4 to reach your doctor's medical assistant. If no one answers, please leave a voicemail as directed and we will return your call as soon as possible. Messages left after 4 pm will be answered the following business day.   You may also send [place] a message via MyChart. We typically respond to MyChart messages within 1-2 business days.  For prescription refills, please ask your pharmacy to contact our office. Our fax number is [phone].  If you have an urgent issue when the clinic is closed that cannot wait until the next business day, you can page your doctor at the number below.    Please note that while we do our best to be available for urgent issues outside of office hours, we are not available 24/7.   If you have an urgent issue and are unable to reach [place], you may choose to seek medical care at your doctor's office, retail clinic, urgent care center, or emergency room.  If you have a medical emergency, please immediately call 911 or go to the emergency department.  Pager Numbers  - Dr. Gwen Pounds: [phone]  - Dr. Roseanne Reno: [phone]  - Dr. Katrinka Blazing: [phone]   In the event of inclement weather, please call our main line at [phone] for an update on the status of any delays or closures.  Dermatology Medication Tips: Please keep the boxes that topical medications come in in order to help keep track of the instructions about where and how to use these. Pharmacies typically print the medication instructions only on the boxes and not directly on the medication tubes.   If your medication is too expensive, please contact our office at [phone] option 4 or send [place] a message through MyChart.   We are unable to tell what your co-pay for  medications will be in advance as this is different depending on your insurance coverage. However, we may be able to find a substitute medication at lower cost or fill out paperwork to get insurance to cover a needed medication.   If a prior authorization is required to get your medication covered by your insurance company, please allow [place] 1-2 business days to complete this process.  Drug prices often vary depending on where the prescription is filled and some pharmacies may offer cheaper prices.  The website www.goodrx.com contains coupons for medications through different pharmacies. The prices here do not account for what the cost may be with help from insurance (it may be cheaper with your insurance), but the website can give you the price if you did not use any insurance.  - You can print the associated coupon and take it with your prescription to the pharmacy.  - You may also stop by our office during regular business hours and pick up a GoodRx coupon card.  - If you need your prescription sent electronically to  a different pharmacy, notify our office through [facility] or by phone at [phone] option 4.     Si Usted Necesita Algo Despus de Su Visita  Tambin puede enviarnos un mensaje a travs de Clinical cytogeneticist. Por lo general respondemos a los mensajes de MyChart en el transcurso de 1 a 2 das hbiles.  Para renovar recetas, por favor pida a su farmacia que se ponga en contacto con nuestra oficina. Annie Sable de fax es [place] [phone].  Si tiene un asunto urgente cuando la clnica est cerrada y que no puede esperar hasta el siguiente da hbil, puede llamar/localizar a su doctor(a) al nmero que aparece a continuacin.   Por favor, tenga en cuenta que aunque hacemos todo lo posible para estar disponibles para asuntos urgentes fuera del horario de [place], no estamos disponibles las 24 horas del da, los 7 [address]  [address] de la [place].   Si tiene un problema urgente y no puede  comunicarse con nosotros, puede optar por buscar atencin mdica  en el consultorio de su doctor(a), en una clnica privada, en un centro de atencin urgente o en una sala de emergencias.  Si tiene Engineer, drilling, por favor llame inmediatamente al 911 o vaya a la sala de emergencias.  Nmeros de bper  - Dr. Gwen Pounds: [phone]  - Dra. Roseanne Reno: [phone]  - Dr. Katrinka Blazing: [phone]   En caso de inclemencias del tiempo, por favor llame a Lacy Duverney principal al [phone] para una actualizacin sobre el [place] de cualquier retraso o cierre.  Consejos para la medicacin en dermatologa: Por favor, guarde las cajas en las que vienen los medicamentos de uso tpico para ayudarle a seguir las instrucciones sobre dnde y cmo usarlos. Las farmacias generalmente imprimen las instrucciones del medicamento slo en las cajas y no directamente en los tubos del [place].   Si su medicamento es muy caro, por favor, pngase en contacto con Rolm Gala llamando al [phone] y presione la opcin 4 o envenos un mensaje a travs de Clinical cytogeneticist.   No podemos decirle cul ser su copago por los medicamentos por adelantado ya que esto es diferente dependiendo de la cobertura de su seguro. Sin embargo, es posible que podamos encontrar un medicamento sustituto a Audiological scientist un formulario para que el seguro cubra el medicamento que se considera necesario.   Si se requiere una autorizacin previa para que su compaa de seguros [place] su medicamento, por favor permtanos de 1 a 2 das hbiles para completar [address]  Los precios de los medicamentos varan con frecuencia dependiendo del Environmental consultant de dnde se surte la receta y alguna farmacias pueden ofrecer precios ms baratos.  El sitio web www.goodrx.com tiene cupones para medicamentos de Health and safety inspector. Los precios aqu no tienen en cuenta lo que podra costar con la ayuda del seguro (puede ser ms barato con su seguro), pero  el sitio web puede darle el precio si no utiliz Tourist information centre manager.  - Puede imprimir el cupn correspondiente y llevarlo con su receta a la farmacia.  - Tambin puede pasar por nuestra oficina durante el horario de atencin regular y Education officer, museum una tarjeta de cupones de GoodRx.  - Si necesita que su receta se enve electrnicamente a una farmacia diferente, informe a nuestra oficina a travs de MyChart de [place] o por telfono llamando al [phone] y presione la opcin 4.

## 2023-11-30 NOTE — Progress Notes (Signed)
   Follow-Up Visit   Subjective  Kiara Donovan is a 72 y.o. female who presents for the following: Botox for facial elastosis, check spot R lower leg, just noticed today, no symptoms  The following portions of the chart were reviewed this encounter and updated as appropriate: medications, allergies, medical history  Review of Systems:  No other skin or systemic complaints except as noted in HPI or Assessment and Plan.  Objective  Well appearing patient in no apparent distress; mood and affect are within normal limits.  A focused examination was performed of the face.  Relevant physical exam findings are noted in the Assessment and Plan.  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  Informed consent was obtained.  Preparation: The area was cleansed with alcohol.  Procedure Details:  Botox was injected into the dermis with a 30-gauge needle. Pressure applied to any bleeding. Ice packs offered for swelling.  Lot Number:  [id number] Expiration:  08/2025  Total Units Injected:  50  Plan: Tylenol may be used for headache.  Allow 2 weeks before returning to clinic for additional dosing as needed. Patient will call for any problems.  Lipoma  Exam: Subcutaneous rubbery nodule(s) Location: R pretibial  Benign-appearing. Exam most consistent with a lipoma. Discussed that a lipoma is a benign fatty growth that can grow over time and sometimes get irritated. Recommend  observation if it is not bothersome or changing. Discussed option of ILK injections or surgical excision to remove it if it is growing, symptomatic, or other changes noted. Please call for new or changing lesions so they can be evaluated.   Return for 3-38m Botox.  I, Ardis Rowan, RMA, am acting as scribe for Armida Sans, MD .   Documentation: I have reviewed the above documentation for accuracy and completeness, and I agree with the above.  Armida Sans, MD

## 2023-12-03 ENCOUNTER — Encounter: Payer: Self-pay | Admitting: Dermatology

## 2024-04-12 ENCOUNTER — Other Ambulatory Visit: Payer: Self-pay | Admitting: Neurology

## 2024-04-12 DIAGNOSIS — M4807 Spinal stenosis, lumbosacral region: Secondary | ICD-10-CM

## 2024-04-14 ENCOUNTER — Other Ambulatory Visit: Payer: Self-pay | Admitting: Neurology

## 2024-04-14 DIAGNOSIS — G43009 Migraine without aura, not intractable, without status migrainosus: Secondary | ICD-10-CM

## 2024-04-16 ENCOUNTER — Ambulatory Visit
Admission: RE | Admit: 2024-04-16 | Discharge: 2024-04-16 | Disposition: A | Discharge status: Home / Self Care | Source: Ambulatory Visit | Attending: Neurology | Admitting: Neurology

## 2024-04-16 DIAGNOSIS — G43009 Migraine without aura, not intractable, without status migrainosus: Secondary | ICD-10-CM | POA: Insufficient documentation

## 2024-04-16 MED ORDER — GADOBUTROL 1 MMOL/ML IV SOLN
5.0000 mL | Freq: Once | INTRAVENOUS | Status: AC | PRN
  Administered 2024-04-16: 5 mL via INTRAVENOUS

## 2024-04-18 ENCOUNTER — Encounter: Payer: Self-pay | Admitting: Neurology

## 2024-04-20 ENCOUNTER — Ambulatory Visit
Admission: RE | Admit: 2024-04-20 | Discharge: 2024-04-20 | Disposition: A | Discharge status: Home / Self Care | Source: Ambulatory Visit | Attending: Neurology | Admitting: Neurology

## 2024-04-20 DIAGNOSIS — M4807 Spinal stenosis, lumbosacral region: Secondary | ICD-10-CM

## 2024-04-25 ENCOUNTER — Ambulatory Visit: Payer: Medicare PPO | Admitting: Dermatology

## 2024-06-20 ENCOUNTER — Encounter: Payer: Self-pay | Admitting: Dermatology

## 2024-06-20 ENCOUNTER — Ambulatory Visit: Payer: Self-pay | Admitting: Dermatology

## 2024-06-20 DIAGNOSIS — L570 Actinic keratosis: Secondary | ICD-10-CM | POA: Diagnosis not present

## 2024-06-20 DIAGNOSIS — L821 Other seborrheic keratosis: Secondary | ICD-10-CM

## 2024-06-20 DIAGNOSIS — L578 Other skin changes due to chronic exposure to nonionizing radiation: Secondary | ICD-10-CM

## 2024-06-20 DIAGNOSIS — W908XXA Exposure to other nonionizing radiation, initial encounter: Secondary | ICD-10-CM

## 2024-06-20 DIAGNOSIS — L820 Inflamed seborrheic keratosis: Secondary | ICD-10-CM

## 2024-06-20 DIAGNOSIS — L988 Other specified disorders of the skin and subcutaneous tissue: Secondary | ICD-10-CM

## 2024-06-20 NOTE — Progress Notes (Signed)
 Follow-Up Visit   Subjective  Kiara Donovan is a 73 y.o. female who presents for the following: Botox for facial elastosis Here for botox followup Patient is also here concerning a itchy spot on left ear, left lower eyelid and a scaly spot on right side of nose. The patient has spots, moles and lesions to be evaluated, some may be new or changing and the patient may have concern these could be cancer.  The following portions of the chart were reviewed this encounter and updated as appropriate: medications, allergies, medical history  Review of Systems:  No other skin or systemic complaints except as noted in HPI or Assessment and Plan.  Objective  Well appearing patient in no apparent distress; mood and affect are within normal limits.  A focused examination was performed of the face.  Relevant physical exam findings are noted in the Assessment and Plan.  Injection map photo   left earlobe x 1, right zygoma x 1, right cheek x 1, left lower eyelid margin x 1 (4) Erythematous stuck-on, waxy papule or plaque right infraorbital / lateral nose x 2, right nose supratip x 1 (3) Erythematous thin papules/macules with gritty scale.   Assessment & Plan   INFLAMED SEBORRHEIC KERATOSIS (4) left earlobe x 1, right zygoma x 1, right cheek x 1, left lower eyelid margin x 1 (4) Symptomatic, irritating, patient would like treated.  Vs milia at left lower eyelid margin  Destruction of lesion - left earlobe x 1, right zygoma x 1, right cheek x 1, left lower eyelid margin x 1 (4) Complexity: simple   Destruction method: cryotherapy   Informed consent: discussed and consent obtained   Timeout:  patient name, date of birth, surgical site, and procedure verified Lesion destroyed using liquid nitrogen: Yes   Region frozen until ice ball extended beyond lesion: Yes   Outcome: patient tolerated procedure well with no complications   Post-procedure details: wound care instructions given    ACTINIC KERATOSIS (3) right infraorbital / lateral nose x 2, right nose supratip x 1 (3) Actinic keratoses are precancerous spots that appear secondary to cumulative UV radiation exposure/sun exposure over time. They are chronic with expected duration over 1 year. A portion of actinic keratoses will progress to squamous cell carcinoma of the skin. It is not possible to reliably predict which spots will progress to skin cancer and so treatment is recommended to prevent development of skin cancer.  Recommend daily broad spectrum sunscreen SPF 30+ to sun-exposed areas, reapply every 2 hours as needed.  Recommend staying in the shade or wearing long sleeves, sun glasses (UVA+UVB protection) and wide brim hats (4-inch brim around the entire circumference of the hat). Call for new or changing lesions. Destruction of lesion - right infraorbital / lateral nose x 2, right nose supratip x 1 (3) Complexity: simple   Destruction method: cryotherapy   Informed consent: discussed and consent obtained   Timeout:  patient name, date of birth, surgical site, and procedure verified Lesion destroyed using liquid nitrogen: Yes   Region frozen until ice ball extended beyond lesion: Yes   Outcome: patient tolerated procedure well with no complications   Post-procedure details: wound care instructions given   ELASTOSIS OF SKIN   ACTINIC SKIN DAMAGE   Facial Elastosis  Botox 50 units total  - Frown Complex - 20 units  - Brow Lift 5 units x 2 - Crows Feet 10 units x 2  Location: See attached image  Informed consent: Discussed  risks (infection, pain, bleeding, bruising, swelling, allergic reaction, paralysis of nearby muscles, eyelid droop, double vision, neck weakness, difficulty breathing, headache, undesirable cosmetic result, and need for additional treatment) and benefits of the procedure, as well as the alternatives.  Informed consent was obtained.  Preparation: The area was cleansed with  alcohol.  Procedure Details:  Botox was injected into the dermis with a 30-gauge needle. Pressure applied to any bleeding. Ice packs offered for swelling.  Lot Number:  [id number] Expiration:  03/2026  Total Units Injected:  50  Plan: Tylenol  may be used for headache.  Allow 2 weeks before returning to clinic for additional dosing as needed. Patient will call for any problems.   SEBORRHEIC KERATOSIS - Stuck-on, waxy, tan-brown papules and/or plaques  - Benign-appearing - Discussed benign etiology and prognosis. - Observe - Call for any changes   ACTINIC DAMAGE - chronic, secondary to cumulative UV radiation exposure/sun exposure over time - diffuse scaly erythematous macules with underlying dyspigmentation - Recommend daily broad spectrum sunscreen SPF 30+ to sun-exposed areas, reapply every 2 hours as needed.  - Recommend staying in the shade or wearing long sleeves, sun glasses (UVA+UVB protection) and wide brim hats (4-inch brim around the entire circumference of the hat). - Call for new or changing lesions.  Return for 3 - 4 month botox followup.  IEleanor Blush, CMA, am acting as scribe for Alm Rhyme, MD.   Documentation: I have reviewed the above documentation for accuracy and completeness, and I agree with the above.  Alm Rhyme, MD

## 2024-06-20 NOTE — Patient Instructions (Addendum)
Seborrheic Keratosis  What causes seborrheic keratoses? Seborrheic keratoses are harmless, common skin growths that first appear during adult life.  As time goes by, more growths appear.  Some people may develop a large number of them.  Seborrheic keratoses appear on both covered and uncovered body parts.  They are not caused by sunlight.  The tendency to develop seborrheic keratoses can be inherited.  They vary in color from skin-colored to gray, brown, or even black.  They can be either smooth or have a rough, warty surface.   Seborrheic keratoses are superficial and look as if they were stuck on the skin.  Under the microscope this type of keratosis looks like layers upon layers of skin.  That is why at times the top layer may seem to fall off, but the rest of the growth remains and re-grows.    Treatment Seborrheic keratoses do not need to be treated, but can easily be removed in the office.  Seborrheic keratoses often cause symptoms when they rub on clothing or jewelry.  Lesions can be in the way of shaving.  If they become inflamed, they can cause itching, soreness, or burning.  Removal of a seborrheic keratosis can be accomplished by freezing, burning, or surgery. If any spot bleeds, scabs, or grows rapidly, please return to have it checked, as these can be an indication of a skin cancer.  Cryotherapy Aftercare  Wash gently with soap and water everyday.   Apply Vaseline and Band-Aid daily until healed.   Actinic keratoses are precancerous spots that appear secondary to cumulative UV radiation exposure/sun exposure over time. They are chronic with expected duration over 1 year. A portion of actinic keratoses will progress to squamous cell carcinoma of the skin. It is not possible to reliably predict which spots will progress to skin cancer and so treatment is recommended to prevent development of skin cancer.  Recommend daily broad spectrum sunscreen SPF 30+ to sun-exposed areas, reapply every  2 hours as needed.  Recommend staying in the shade or wearing long sleeves, sun glasses (UVA+UVB protection) and wide brim hats (4-inch brim around the entire circumference of the hat). Call for new or changing lesions.      Due to recent changes in healthcare laws, you may see results of your pathology and/or laboratory studies on MyChart before the doctors have had a chance to review them. We understand that in some cases there may be results that are confusing or concerning to you. Please understand that not all results are received at the same time and often the doctors may need to interpret multiple results in order to provide you with the best plan of care or course of treatment. Therefore, we ask that you please give [place] 2 business days to thoroughly review all your results before contacting the office for clarification. Should we see a critical lab result, you will be contacted sooner.   If You Need Anything After Your Visit  If you have any questions or concerns for your doctor, please call our main line at [phone] and press option 4 to reach your doctor's medical assistant. If no one answers, please leave a voicemail as directed and we will return your call as soon as possible. Messages left after 4 pm will be answered the following business day.   You may also send [place] a message via MyChart. We typically respond to MyChart messages within 1-2 business days.  For prescription refills, please ask your pharmacy to contact our office. Our  fax number is [phone].  If you have an urgent issue when the clinic is closed that cannot wait until the next business day, you can page your doctor at the number below.    Please note that while we do our best to be available for urgent issues outside of office hours, we are not available 24/7.   If you have an urgent issue and are unable to reach [place], you may choose to seek medical care at your doctor's office, retail clinic, urgent care  center, or emergency room.  If you have a medical emergency, please immediately call 911 or go to the emergency department.  Pager Numbers  - Dr. Gwen Pounds: [phone]  - Dr. Roseanne Reno: [phone]  - Dr. Katrinka Blazing: [phone]   In the event of inclement weather, please call our main line at [phone] for an update on the status of any delays or closures.  Dermatology Medication Tips: Please keep the boxes that topical medications come in in order to help keep track of the instructions about where and how to use these. Pharmacies typically print the medication instructions only on the boxes and not directly on the medication tubes.   If your medication is too expensive, please contact our office at [phone] option 4 or send [place] a message through MyChart.   We are unable to tell what your co-pay for medications will be in advance as this is different depending on your insurance coverage. However, we may be able to find a substitute medication at lower cost or fill out paperwork to get insurance to cover a needed medication.   If a prior authorization is required to get your medication covered by your insurance company, please allow [place] 1-2 business days to complete this process.  Drug prices often vary depending on where the prescription is filled and some pharmacies may offer cheaper prices.  The website www.goodrx.com contains coupons for medications through different pharmacies. The prices here do not account for what the cost may be with help from insurance (it may be cheaper with your insurance), but the website can give you the price if you did not use any insurance.  - You can print the associated coupon and take it with your prescription to the pharmacy.  - You may also stop by our office during regular business hours and pick up a GoodRx coupon card.  - If you need your prescription sent electronically to a different pharmacy, notify our office through [facility] or by  phone at [phone] option 4.     Si Usted Necesita Algo Despus de Su Visita  Tambin puede enviarnos un mensaje a travs de Clinical cytogeneticist. Por lo general respondemos a los mensajes de MyChart en el transcurso de 1 a 2 das hbiles.  Para renovar recetas, por favor pida a su farmacia que se ponga en contacto con nuestra oficina. Annie Sable de fax es [place] [phone].  Si tiene un asunto urgente cuando la clnica est cerrada y que no puede esperar hasta el siguiente da hbil, puede llamar/localizar a su doctor(a) al nmero que aparece a continuacin.   Por favor, tenga en cuenta que aunque hacemos todo lo posible para estar disponibles para asuntos urgentes fuera del horario de [place], no estamos disponibles las 24 horas del da, los 7 [address]  [address] de la [place].   Si tiene un problema urgente y no puede comunicarse con nosotros, puede optar por buscar atencin mdica  en el consultorio de su doctor(a), en una clnica privada, en un  centro de atencin urgente o en una sala de emergencias.  Si tiene Engineer, drilling, por favor llame inmediatamente al 911 o vaya a la sala de emergencias.  Nmeros de bper  - Dr. Gwen Pounds: [phone]  - Dra. Roseanne Reno: [phone]  - Dr. Katrinka Blazing: [phone]   En caso de inclemencias del tiempo, por favor llame a Lacy Duverney principal al [phone] para una actualizacin sobre el [place] de cualquier retraso o cierre.  Consejos para la medicacin en dermatologa: Por favor, guarde las cajas en las que vienen los medicamentos de uso tpico para ayudarle a seguir las instrucciones sobre dnde y cmo usarlos. Las farmacias generalmente imprimen las instrucciones del medicamento slo en las cajas y no directamente en los tubos del [place].   Si su medicamento es muy caro, por favor, pngase en contacto con Rolm Gala llamando al [phone] y presione la opcin 4 o envenos un mensaje a travs de Clinical cytogeneticist.   No podemos decirle cul ser su copago  por los medicamentos por adelantado ya que esto es diferente dependiendo de la cobertura de su seguro. Sin embargo, es posible que podamos encontrar un medicamento sustituto a Audiological scientist un formulario para que el seguro cubra el medicamento que se considera necesario.   Si se requiere una autorizacin previa para que su compaa de seguros [place] su medicamento, por favor permtanos de 1 a 2 das hbiles para completar [address]  Los precios de los medicamentos varan con frecuencia dependiendo del Environmental consultant de dnde se surte la receta y alguna farmacias pueden ofrecer precios ms baratos.  El sitio web www.goodrx.com tiene cupones para medicamentos de Health and safety inspector. Los precios aqu no tienen en cuenta lo que podra costar con la ayuda del seguro (puede ser ms barato con su seguro), pero el sitio web puede darle el precio si no utiliz Tourist information centre manager.  - Puede imprimir el cupn correspondiente y llevarlo con su receta a la farmacia.  - Tambin puede pasar por nuestra oficina durante el horario de atencin regular y Education officer, museum una tarjeta de cupones de GoodRx.  - Si necesita que su receta se enve electrnicamente a una farmacia diferente, informe a nuestra oficina a travs de MyChart de [place] o por telfono llamando al [phone] y presione la opcin 4.

## 2024-08-04 ENCOUNTER — Other Ambulatory Visit: Payer: Self-pay | Admitting: Family Medicine

## 2024-08-04 DIAGNOSIS — E782 Mixed hyperlipidemia: Secondary | ICD-10-CM

## 2024-08-07 ENCOUNTER — Ambulatory Visit
Admission: RE | Admit: 2024-08-07 | Discharge: 2024-08-07 | Disposition: A | Discharge status: Home / Self Care | Payer: Self-pay | Source: Ambulatory Visit | Attending: Family Medicine | Admitting: Family Medicine

## 2024-08-07 DIAGNOSIS — E782 Mixed hyperlipidemia: Secondary | ICD-10-CM | POA: Insufficient documentation

## 2025-01-10 ENCOUNTER — Ambulatory Visit: Admitting: Dermatology

## 2025-01-25 ENCOUNTER — Encounter: Payer: Self-pay | Admitting: Dermatology

## 2025-01-25 ENCOUNTER — Ambulatory Visit (INDEPENDENT_AMBULATORY_CARE_PROVIDER_SITE_OTHER): Payer: Self-pay | Admitting: Dermatology

## 2025-01-25 DIAGNOSIS — L578 Other skin changes due to chronic exposure to nonionizing radiation: Secondary | ICD-10-CM

## 2025-01-25 DIAGNOSIS — L650 Telogen effluvium: Secondary | ICD-10-CM

## 2025-01-25 DIAGNOSIS — Z7189 Other specified counseling: Secondary | ICD-10-CM

## 2025-01-25 DIAGNOSIS — L988 Other specified disorders of the skin and subcutaneous tissue: Secondary | ICD-10-CM

## 2025-01-25 DIAGNOSIS — Z79899 Other long term (current) drug therapy: Secondary | ICD-10-CM

## 2025-01-25 DIAGNOSIS — L820 Inflamed seborrheic keratosis: Secondary | ICD-10-CM

## 2025-01-25 NOTE — Patient Instructions (Addendum)
 2 g collagen (Vital Protein) 10 mg Biotin  [place]  [place]  [place]   Due to recent changes in healthcare laws, you may see results of your pathology and/or laboratory studies on MyChart before the doctors have had a chance to review them. We understand that in some cases there may be results that are confusing or concerning to you. Please understand that not all results are received at the same time and often the doctors may need to interpret multiple results in order to provide you with the best plan of care or course of treatment. Therefore, we ask that you please give us  2 business days to thoroughly review all your results before contacting the office for clarification. Should we see a critical lab result, you will be contacted sooner.   If You Need Anything After Your Visit  If you have any questions or concerns for your doctor, please call our main line at [phone] and press option 4 to reach your doctor's medical assistant. If no one answers, please leave a voicemail as directed and we will return your call as soon as possible. Messages left after 4 pm will be answered the following business day.   You may also send us  a message via MyChart. We typically respond to MyChart messages within 1-2 business days.  For prescription refills, please ask your pharmacy to contact our office. Our fax number is [phone].  If you have an urgent issue when the clinic is closed that cannot wait until the next business day, you can page your doctor at the number below.    Please note that while we do our best to be available for urgent issues outside of office hours, we are not available 24/7.   If you have an urgent issue and are unable to reach us , you may choose to seek medical care at your doctor's office, retail clinic, urgent care center, or emergency room.  If you have a medical emergency, please immediately call 911 or go to the emergency department.  Pager Numbers  - Dr.  Hester: [phone]  - Dr. Jackquline: [phone]  - Dr. Claudene: [phone]   - Dr. Raymund: [phone]  In the event of inclement weather, please call our main line at [phone] for an update on the status of any delays or closures.  Dermatology Medication Tips: Please keep the boxes that topical medications come in in order to help keep track of the instructions about where and how to use these. Pharmacies typically print the medication instructions only on the boxes and not directly on the medication tubes.   If your medication is too expensive, please contact our office at [phone] option 4 or send us  a message through MyChart.   We are unable to tell what your co-pay for medications will be in advance as this is different depending on your insurance coverage. However, we may be able to find a substitute medication at lower cost or fill out paperwork to get insurance to cover a needed medication.   If a prior authorization is required to get your medication covered by your insurance company, please allow us  1-2 business days to complete this process.  Drug prices often vary depending on where the prescription is filled and some pharmacies may offer cheaper prices.  The website www.goodrx.com contains coupons for medications through different pharmacies. The prices here do not account for what the cost may be with help from insurance (it may be cheaper with your insurance), but the website can give  you the price if you did not use any insurance.  - You can print the associated coupon and take it with your prescription to the pharmacy.  - You may also stop by our office during regular business hours and pick up a GoodRx coupon card.  - If you need your prescription sent electronically to a different pharmacy, notify our office through [facility] or by phone at [phone] option 4.     Si Usted Necesita Algo Despus de Su Visita  Tambin puede enviarnos un mensaje  a travs de Clinical Cytogeneticist. Por lo general respondemos a los mensajes de MyChart en el transcurso de 1 a 2 das hbiles.  Para renovar recetas, por favor pida a su farmacia que se ponga en contacto con nuestra oficina. Randi lakes de fax es [place] [phone].  Si tiene un asunto urgente cuando la clnica est cerrada y que no puede esperar hasta el siguiente da hbil, puede llamar/localizar a su doctor(a) al nmero que aparece a continuacin.   Por favor, tenga en cuenta que aunque hacemos todo lo posible para estar disponibles para asuntos urgentes fuera del horario de [place], no estamos disponibles las 24 horas del da, los 7 [address] de la [place].   Si tiene un problema urgente y no puede comunicarse con nosotros, puede optar por buscar atencin mdica  en el consultorio de su doctor(a), en una clnica privada, en un centro de atencin urgente o en una sala de emergencias.  Si tiene engineer, drilling, por favor llame inmediatamente al 911 o vaya a la sala de emergencias.  Nmeros de bper  - Dr. Hester: [phone]  - Dra. Jackquline: [phone]  - Dr. Claudene: [phone]  - Dra. Kitts: [phone]  En caso de inclemencias del [place], por favor llame a nuestra lnea principal al [phone] para una actualizacin sobre el [place] de cualquier retraso o cierre.  Consejos para la medicacin en dermatologa: Por favor, guarde las cajas en las que vienen los medicamentos de uso tpico para ayudarle a seguir las instrucciones sobre dnde y cmo usarlos. Las farmacias generalmente imprimen las instrucciones del medicamento slo en las cajas y no directamente en los tubos del [place].   Si su medicamento es muy caro, por favor, pngase en contacto con landry rieger llamando al [phone] y presione la opcin 4 o envenos un mensaje a travs de Clinical Cytogeneticist.   No podemos decirle cul ser su copago por los medicamentos por adelantado ya que esto es diferente dependiendo de la cobertura de  su seguro. Sin embargo, es posible que podamos encontrar un medicamento sustituto a audiological scientist un formulario para que el seguro cubra el medicamento que se considera necesario.   Si se requiere una autorizacin previa para que su compaa de seguros cubra su medicamento, por favor permtanos de 1 a 2 das hbiles para completar este proceso.  Los precios de los medicamentos varan con frecuencia dependiendo del environmental consultant de dnde se surte la receta y alguna farmacias pueden ofrecer precios ms baratos.  El sitio web www.goodrx.com tiene cupones para medicamentos de health and safety inspector. Los precios aqu no tienen en cuenta lo que podra costar con la ayuda del seguro (puede ser ms barato con su seguro), pero el sitio web puede darle el precio si no utiliz tourist information centre manager.  - Puede imprimir el cupn correspondiente y llevarlo con su receta a la farmacia.  - Tambin puede pasar por nuestra oficina durante el horario de atencin regular y education officer, museum una tarjeta de cupones  de GoodRx.  - Si necesita que su receta se enve electrnicamente a una farmacia diferente, informe a nuestra oficina a travs de MyChart de [place] o por telfono llamando al [phone] y presione la opcin 4.

## 2025-01-25 NOTE — Progress Notes (Signed)
 "  Follow-Up Visit   Subjective  Kiara Donovan is a 74 y.o. female who presents for the following: Botox. Patient would also like her back and chest checked. She has spot spots that are itching. Also recheck ISK that was treated prior at left lower eyelid margin. The patient has spots, moles and lesions to be evaluated, some may be new or changing and the patient may have concern these could be cancer.  The following portions of the chart were reviewed this encounter and updated as appropriate: medications, allergies, medical history  Review of Systems:  No other skin or systemic complaints except as noted in HPI or Assessment and Plan.  Objective  Well appearing patient in no apparent distress; mood and affect are within normal limits.   A focused examination was performed of the following areas: Face, chest, back  Relevant exam findings are noted in the Assessment and Plan.  face Rhytides and volume loss.   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 TELOGEN EFFLUVIUM Exam: Diffuse thinning of hair, positive hair pull test.  Telogen effluvium is a benign, self-limited condition causing increased hair shedding usually for several months. It does not progress to baldness, and the hair eventually grows back on its own. It can be triggered by recent illness, recent surgery, thyroid  disease, low iron stores, vitamin D  deficiency, fad diets or rapid weight loss, hormonal changes such as pregnancy or birth control pills, and some medication. Usually the hair loss starts 2-3 months after the illness or health change. Rarely, it can continue for longer than a year. Treatments options may include oral or topical Minoxidil; Red Light scalp  treatments; Biotin 2.5 mg daily and other options. Treatment Plan: Recommend patient use red light cap and start Biotin 10mg , 2 g collagen daily.   Long term medication management.  Patient is using long term (months to years) prescription medication  to control their dermatologic condition.  These medications require periodic monitoring to evaluate for efficacy and side effects and may require periodic laboratory monitoring.    ELASTOSIS OF SKIN face Frown Complex - 20 units Crows Feet  - 20 units (10 each side) Brow Lift - 10 units (5 units each side) - Filling material injection - face Location: See attached image  Informed consent: Discussed risks (infection, pain, bleeding, bruising, swelling, allergic reaction, paralysis of nearby muscles, eyelid droop, double vision, neck weakness, difficulty breathing, headache, undesirable cosmetic result, and need for additional treatment) and benefits of the procedure, as well as the alternatives.  Informed consent was obtained.  Preparation: The area was cleansed with alcohol.  Procedure Details:  Botox was injected into the dermis with a 30-gauge needle. Pressure applied to any bleeding. Ice packs offered for swelling.  Lot Number:  [id number] Expiration:  11/2026  Total Units Injected:  50  Plan: Tylenol  may be used for headache.  Allow 2 weeks before returning to clinic for additional dosing as needed. Patient will call for any problems.   INFLAMED SEBORRHEIC KERATOSIS left lower eyelid margin/lashline x 1 Symptomatic, irritating, patient would like treated.  Benign-appearing.  Call clinic for new or changing lesions.   - Destruction of lesion - left lower eyelid margin/lashline x 1 Complexity: simple   Destruction method: cryotherapy  Informed consent: discussed and consent obtained   Timeout:  patient name, date of birth, surgical site, and procedure verified Lesion destroyed using liquid nitrogen: Yes   Region frozen until  ice ball extended beyond lesion: Yes   Outcome: patient tolerated procedure well with no complications   Post-procedure details: wound care instructions given    SEBORRHEIC KERATOSIS, INFLAMED (22) mid chest x 1, L upper back x 21 (22) Symptomatic, irritating, patient would like treated.  Benign-appearing.  Call clinic for new or changing lesions.   - Destruction of lesion - mid chest x 1, L upper back x 21 (22) Complexity: simple   Destruction method: cryotherapy   Informed consent: discussed and consent obtained   Timeout:  patient name, date of birth, surgical site, and procedure verified Lesion destroyed using liquid nitrogen: Yes   Region frozen until ice ball extended beyond lesion: Yes   Outcome: patient tolerated procedure well with no complications   Post-procedure details: wound care instructions given    TELOGEN EFFLUVIUM   COUNSELING AND COORDINATION OF CARE   MEDICATION MANAGEMENT   ACTINIC SKIN DAMAGE    SEBORRHEIC KERATOSIS - Stuck-on, waxy, tan-brown papules and/or plaques  - Benign-appearing - Discussed benign etiology and prognosis. - Observe - Call for any changes  ACTINIC DAMAGE - chronic, secondary to cumulative UV radiation exposure/sun exposure over time - diffuse scaly erythematous macules with underlying dyspigmentation - Recommend daily broad spectrum sunscreen SPF 30+ to sun-exposed areas, reapply every 2 hours as needed.  - Recommend staying in the shade or wearing long sleeves, sun glasses (UVA+UVB protection) and wide brim hats (4-inch brim around the entire circumference of the hat). - Call for new or changing lesions.  Return if symptoms worsen or fail to improve.  [name] Lonell Drones, RMA, am acting as scribe for Alm Rhyme, MD .   Documentation: I have reviewed the above documentation for accuracy and completeness, and I agree with the above.  Alm Rhyme, MD    "

## 2025-09-05 ENCOUNTER — Ambulatory Visit: Admitting: Dermatology
